# Patient Record
Sex: Female | Born: 1990 | Race: Black or African American | Hispanic: No | Marital: Single | Smoking: Former smoker
Health system: Southern US, Community
[De-identification: ages and names within clinical notes are randomized; demographics above are authoritative.]

## PROBLEM LIST (undated history)

## (undated) DIAGNOSIS — O039 Complete or unspecified spontaneous abortion without complication: Secondary | ICD-10-CM

## (undated) HISTORY — DX: Complete or unspecified spontaneous abortion without complication: O03.9

---

## 2020-08-09 ENCOUNTER — Emergency Department (HOSPITAL_COMMUNITY)
Admission: EM | Admit: 2020-08-09 | Discharge: 2020-08-10 | Disposition: A | Discharge status: Home / Self Care | Payer: Medicaid Other | Attending: Emergency Medicine | Admitting: Emergency Medicine

## 2020-08-09 DIAGNOSIS — R112 Nausea with vomiting, unspecified: Secondary | ICD-10-CM | POA: Insufficient documentation

## 2020-08-09 DIAGNOSIS — F17200 Nicotine dependence, unspecified, uncomplicated: Secondary | ICD-10-CM | POA: Insufficient documentation

## 2020-08-09 DIAGNOSIS — R1013 Epigastric pain: Secondary | ICD-10-CM | POA: Insufficient documentation

## 2020-08-09 DIAGNOSIS — R109 Unspecified abdominal pain: Secondary | ICD-10-CM

## 2020-08-10 ENCOUNTER — Other Ambulatory Visit: Payer: Self-pay

## 2020-08-10 ENCOUNTER — Emergency Department (HOSPITAL_COMMUNITY): Payer: Medicaid Other

## 2020-08-10 ENCOUNTER — Encounter (HOSPITAL_COMMUNITY): Payer: Self-pay | Admitting: Emergency Medicine

## 2020-08-10 LAB — CBC
HCT: 46.1 % — ABNORMAL HIGH (ref 36.0–46.0)
Hemoglobin: 15.3 g/dL — ABNORMAL HIGH (ref 12.0–15.0)
MCH: 31.9 pg (ref 26.0–34.0)
MCHC: 33.2 g/dL (ref 30.0–36.0)
MCV: 96.2 fL (ref 80.0–100.0)
Platelets: 272 10*3/uL (ref 150–400)
RBC: 4.79 MIL/uL (ref 3.87–5.11)
RDW: 12.4 % (ref 11.5–15.5)
WBC: 14.2 10*3/uL — ABNORMAL HIGH (ref 4.0–10.5)
nRBC: 0 % (ref 0.0–0.2)

## 2020-08-10 LAB — COMPREHENSIVE METABOLIC PANEL
ALT: 23 U/L (ref 0–44)
AST: 22 U/L (ref 15–41)
Albumin: 3.8 g/dL (ref 3.5–5.0)
Alkaline Phosphatase: 70 U/L (ref 38–126)
Anion gap: 10 (ref 5–15)
BUN: 16 mg/dL (ref 6–20)
CO2: 27 mmol/L (ref 22–32)
Calcium: 8.7 mg/dL — ABNORMAL LOW (ref 8.9–10.3)
Chloride: 104 mmol/L (ref 98–111)
Creatinine, Ser: 0.83 mg/dL (ref 0.44–1.00)
GFR, Estimated: >60 mL/min (ref >60)
Glucose, Bld: 95 mg/dL (ref 70–99)
Potassium: 3.9 mmol/L (ref 3.5–5.1)
Sodium: 141 mmol/L (ref 135–145)
Total Bilirubin: 0.5 mg/dL (ref 0.3–1.2)
Total Protein: 7.5 g/dL (ref 6.5–8.1)

## 2020-08-10 LAB — URINALYSIS, ROUTINE W REFLEX MICROSCOPIC
Bilirubin Urine: NEGATIVE
Glucose, UA: NEGATIVE mg/dL
Hgb urine dipstick: NEGATIVE
Ketones, ur: NEGATIVE mg/dL
Nitrite: NEGATIVE
Protein, ur: NEGATIVE mg/dL
Specific Gravity, Urine: 1.01 (ref 1.005–1.030)
pH: 7.5 (ref 5.0–8.0)

## 2020-08-10 LAB — LIPASE, BLOOD: Lipase: 39 U/L (ref 11–51)

## 2020-08-10 MED ORDER — OMEPRAZOLE 20 MG PO CPDR: 20.0000 mg | DELAYED_RELEASE_CAPSULE | Freq: Every day | ORAL | 1 refills | Status: DC

## 2020-08-10 MED ORDER — SUCRALFATE 1 G PO TABS: 1.0000 g | ORAL_TABLET | Freq: Four times a day (QID) | ORAL | 0 refills | Status: DC | PRN

## 2020-08-10 MED ORDER — IOHEXOL 300 MG/ML  SOLN
100.0000 mL | Freq: Once | INTRAMUSCULAR | Status: AC | PRN
  Administered 2020-08-10: 100 mL via INTRAVENOUS

## 2020-08-10 MED ORDER — ALUM & MAG HYDROXIDE-SIMETH 200-200-20 MG/5ML PO SUSP
30.0000 mL | Freq: Once | ORAL | Status: AC
  Administered 2020-08-10: 30 mL via ORAL
  Filled 2020-08-10: qty 30

## 2020-08-10 MED ORDER — PROCHLORPERAZINE EDISYLATE 10 MG/2ML IJ SOLN
10.0000 mg | Freq: Once | INTRAMUSCULAR | Status: AC
  Administered 2020-08-10: 10 mg via INTRAVENOUS
  Filled 2020-08-10: qty 2

## 2020-08-10 MED ORDER — HALOPERIDOL LACTATE 5 MG/ML IJ SOLN
2.0000 mg | Freq: Once | INTRAMUSCULAR | Status: AC
  Administered 2020-08-10: 2 mg via INTRAVENOUS
  Filled 2020-08-10: qty 1

## 2020-08-10 MED ORDER — HYDROCODONE-ACETAMINOPHEN 5-325 MG PO TABS
1.0000 | ORAL_TABLET | Freq: Once | ORAL | Status: AC
  Administered 2020-08-10: 1 via ORAL
  Filled 2020-08-10: qty 1

## 2020-08-10 MED ORDER — FAMOTIDINE IN NACL 20-0.9 MG/50ML-% IV SOLN
20.0000 mg | Freq: Once | INTRAVENOUS | Status: AC
  Administered 2020-08-10: 20 mg via INTRAVENOUS
  Filled 2020-08-10: qty 50

## 2020-08-10 MED ORDER — ONDANSETRON HCL 4 MG/2ML IJ SOLN
4.0000 mg | Freq: Once | INTRAMUSCULAR | Status: AC
  Administered 2020-08-10: 4 mg via INTRAVENOUS
  Filled 2020-08-10: qty 2

## 2020-08-10 NOTE — ED Triage Notes (Signed)
Pt with c/o "severe" lower abdominal pain that radiates around to back that started a couple of hours ago. Denies any urinary symptoms. Pt tearful in triage.

## 2020-08-10 NOTE — ED Notes (Signed)
Pt vomiting at this time- emesis bag given to pt

## 2020-08-10 NOTE — Discharge Instructions (Addendum)
You were evaluated in the Emergency Department and after careful evaluation, we did not find any emergent condition requiring admission or further testing in the hospital.  Your exam/testing today is overall reassuring.  Symptoms seem to be due to inflammation of the lining of the stomach.  We recommend taking the omeprazole medication daily to prevent pain.  You can also use the Carafate medication as needed throughout the day for discomfort.  Please return to the Emergency Department if you experience any worsening of your condition.   Thank you for allowing [place] to be a part of your care.

## 2020-08-10 NOTE — ED Provider Notes (Signed)
AP-EMERGENCY DEPT [facility] Emergency Department Provider Note MRN:  [id number]  Arrival date & time: 08/10/20     Chief Complaint   Abdominal Pain   History of Present Illness   Kristen Jennings is a 30 y.o. year-old female with a history of C-section presenting to the ED with chief complaint of abdominal.  Location: Diffuse but worse in the epigastrium and right upper and right lower quadrants Duration: 12 hours Onset: Gradual Timing: Constant Description: Sharp Severity: Severe, just as bad as labor with her kids Exacerbating/Alleviating Factors: Worse with meals today Associated Symptoms: Nausea and few episodes of nonbloody nonbilious emesis Pertinent Negatives: Denies fever, no headache or vision change, no chest pain or shortness of breath, no dysuria, no vaginal bleeding or discharge   Review of Systems  A complete 10 system review of systems was obtained and all systems are negative except as noted in the HPI and PMH.   Patient's Health History   History reviewed. No pertinent past medical history.  Past Surgical History:  Procedure Laterality Date  . CESAREAN SECTION      History reviewed. No pertinent family history.  Social History   Socioeconomic History  . Marital status: Married    Spouse name: Not on file  . Number of children: Not on file  . Years of education: Not on file  . Highest education level: Not on file  Occupational History  . Not on file  Tobacco Use  . Smoking status: Current Every Day Smoker  . Smokeless tobacco: Never Used  Vaping Use  . Vaping Use: Never used  Substance and Sexual Activity  . Alcohol use: Yes    Comment: daily (wine)  . Drug use: Never  . Sexual activity: Yes  Other Topics Concern  . Not on file  Social History Narrative  . Not on file   Social Determinants of Health   Financial Resource Strain: Not on file  Food Insecurity: Not on file  Transportation Needs: Not on file  Physical Activity: Not  on file  Stress: Not on file  Social Connections: Not on file  Intimate Partner Violence: Not on file     Physical Exam   Vitals:   08/10/20 0130 08/10/20 0330  BP: 113/64 109/69  Pulse: 79 65  Resp: 17 16  Temp:    SpO2: 100% 98%    CONSTITUTIONAL: Well-appearing, NAD NEURO:  Alert and oriented x 3, no focal deficits EYES:  eyes equal and reactive ENT/NECK:  no LAD, no JVD CARDIO: Regular rate, well-perfused, normal S1 and S2 PULM:  CTAB no wheezing or rhonchi GI/GU:  normal bowel sounds, non-distended, non-tender MSK/SPINE:  No gross deformities, no edema SKIN:  no rash, atraumatic PSYCH:  Appropriate speech and behavior  *Additional and/or pertinent findings included in MDM below  Diagnostic and Interventional Summary    EKG Interpretation  Date/Time:  Sunday August 10 2020 02:48:20 EDT Ventricular Rate:  63 PR Interval:  139 QRS Duration: 86 QT Interval:  424 QTC Calculation: 434 R Axis:   42 Text Interpretation: Sinus rhythm Confirmed by Kennis Carina [phone]) on 08/10/2020 2:50:08 AM      Labs Reviewed  COMPREHENSIVE METABOLIC PANEL - Abnormal; Notable for the following components:      Result Value   Calcium 8.7 (*)    All other components within normal limits  CBC - Abnormal; Notable for the following components:   WBC 14.2 (*)    Hemoglobin 15.3 (*)    HCT 46.1 (*)  All other components within normal limits  URINALYSIS, ROUTINE W REFLEX MICROSCOPIC - Abnormal; Notable for the following components:   Color, Urine YELLOW (*)    APPearance CLEAR (*)    Leukocytes,Ua LARGE (*)    All other components within normal limits  LIPASE, BLOOD  POC URINE PREG, ED    CT ABDOMEN PELVIS W CONTRAST  Final Result      Medications  famotidine (PEPCID) IVPB 20 mg premix (0 mg Intravenous Stopped 08/10/20 0149)  alum & mag hydroxide-simeth (MAALOX/MYLANTA) 200-200-20 MG/5ML suspension 30 mL (30 mLs Oral Given 08/10/20 0052)  ondansetron (ZOFRAN) injection 4 mg  (4 mg Intravenous Given 08/10/20 0053)  iohexol (OMNIPAQUE) 300 MG/ML solution 100 mL (100 mLs Intravenous Contrast Given 08/10/20 0109)  HYDROcodone-acetaminophen (NORCO/VICODIN) 5-325 MG per tablet 1 tablet (1 tablet Oral Given 08/10/20 0208)  prochlorperazine (COMPAZINE) injection 10 mg (10 mg Intravenous Given 08/10/20 0230)  haloperidol lactate (HALDOL) injection 2 mg (2 mg Intravenous Given 08/10/20 0244)     Procedures  /  Critical Care Procedures  ED Course and Medical Decision Making  I have reviewed the triage vital signs, the nursing notes, and pertinent available records from the EMR.  Listed above are laboratory and imaging tests that I personally ordered, reviewed, and interpreted and then considered in my medical decision making (see below for details).  Differential diagnosis including pancreatitis, gastritis, biliary colic, less likely perforated viscus given her overall nontoxic-appearing nature, reassuring vital signs, lack of fever, lack of rebound/guarding/rigidity on exam.  Given the severity of pain however, will obtain CT imaging.     CT is reassuring, labs reassuring, patient feeling much better after medications listed above, appropriate for discharge with return precautions.  Elmer Sow. Pilar Plate, MD [facility] Emergency Medicine [facility] [email or web address]  Final Clinical Impressions(s) / ED Diagnoses     ICD-10-CM   1. Abdominal pain, unspecified abdominal location  R10.9     ED Discharge Orders         Ordered    omeprazole (PRILOSEC) 20 MG capsule  Daily        08/10/20 0405    sucralfate (CARAFATE) 1 g tablet  4 times daily PRN        08/10/20 0405           Discharge Instructions Discussed with and Provided to Patient:     Discharge Instructions     You were evaluated in the Emergency Department and after careful evaluation, we did not find any emergent condition requiring admission or further testing in the hospital.  Your  exam/testing today is overall reassuring.  Symptoms seem to be due to inflammation of the lining of the stomach.  We recommend taking the omeprazole medication daily to prevent pain.  You can also use the Carafate medication as needed throughout the day for discomfort.  Please return to the Emergency Department if you experience any worsening of your condition.   Thank you for allowing [place] to be a part of your care.       Sabas Sous, MD 08/10/20 [phone]

## 2021-04-06 ENCOUNTER — Other Ambulatory Visit: Payer: Medicaid Other

## 2021-04-06 ENCOUNTER — Telehealth: Payer: Self-pay | Admitting: *Deleted

## 2021-04-06 DIAGNOSIS — O209 Hemorrhage in early pregnancy, unspecified: Secondary | ICD-10-CM

## 2021-04-06 NOTE — Telephone Encounter (Signed)
Patient came into the office stating she is early pregnant with a confirmation completed at [facility] with LMP 03/03/21.  States she had some heavy bleeding over the weekend but is lighter today and is having some mild cramping.  Informed patient she is too early for an ultrasound as she is only [redacted]w[redacted]d.  Can have blood work completed today as pregnancy is too early for ultrasound.  Pt agreeable to have blood work.  U/S scheduled for 3 weeks out but could change based on labs.

## 2021-04-07 ENCOUNTER — Other Ambulatory Visit: Payer: Self-pay | Admitting: Adult Health

## 2021-04-07 DIAGNOSIS — Z3201 Encounter for pregnancy test, result positive: Secondary | ICD-10-CM

## 2021-04-07 LAB — PROGESTERONE: Progesterone: 0.1 ng/mL

## 2021-04-07 LAB — BETA HCG QUANT (REF LAB): hCG Quant: 7 m[IU]/mL

## 2021-04-09 ENCOUNTER — Other Ambulatory Visit: Payer: Self-pay

## 2021-04-09 ENCOUNTER — Encounter (HOSPITAL_COMMUNITY): Payer: Self-pay

## 2021-04-09 ENCOUNTER — Emergency Department (HOSPITAL_COMMUNITY)
Admission: EM | Admit: 2021-04-09 | Discharge: 2021-04-09 | Disposition: A | Discharge status: Home / Self Care | Payer: Medicaid Other | Attending: Emergency Medicine | Admitting: Emergency Medicine

## 2021-04-09 DIAGNOSIS — O209 Hemorrhage in early pregnancy, unspecified: Secondary | ICD-10-CM | POA: Diagnosis present

## 2021-04-09 DIAGNOSIS — N939 Abnormal uterine and vaginal bleeding, unspecified: Secondary | ICD-10-CM

## 2021-04-09 DIAGNOSIS — Z3A01 Less than 8 weeks gestation of pregnancy: Secondary | ICD-10-CM | POA: Diagnosis not present

## 2021-04-09 DIAGNOSIS — Z87891 Personal history of nicotine dependence: Secondary | ICD-10-CM | POA: Insufficient documentation

## 2021-04-09 LAB — CBC WITH DIFFERENTIAL/PLATELET
Abs Immature Granulocytes: 0.03 10*3/uL (ref 0.00–0.07)
Basophils Absolute: 0.1 10*3/uL (ref 0.0–0.1)
Basophils Relative: 1 %
Eosinophils Absolute: 0.1 10*3/uL (ref 0.0–0.5)
Eosinophils Relative: 1 %
HCT: 45.6 % (ref 36.0–46.0)
Hemoglobin: 15.4 g/dL — ABNORMAL HIGH (ref 12.0–15.0)
Immature Granulocytes: 0 %
Lymphocytes Relative: 23 %
Lymphs Abs: 2.2 10*3/uL (ref 0.7–4.0)
MCH: 32.8 pg (ref 26.0–34.0)
MCHC: 33.8 g/dL (ref 30.0–36.0)
MCV: 97 fL (ref 80.0–100.0)
Monocytes Absolute: 0.7 10*3/uL (ref 0.1–1.0)
Monocytes Relative: 7 %
Neutro Abs: 6.5 10*3/uL (ref 1.7–7.7)
Neutrophils Relative %: 68 %
Platelets: 277 10*3/uL (ref 150–400)
RBC: 4.7 MIL/uL (ref 3.87–5.11)
RDW: 12.6 % (ref 11.5–15.5)
WBC: 9.6 10*3/uL (ref 4.0–10.5)
nRBC: 0 % (ref 0.0–0.2)

## 2021-04-09 LAB — URINALYSIS, ROUTINE W REFLEX MICROSCOPIC
Bilirubin Urine: NEGATIVE
Glucose, UA: NEGATIVE mg/dL
Hgb urine dipstick: NEGATIVE
Ketones, ur: NEGATIVE mg/dL
Leukocytes,Ua: NEGATIVE
Nitrite: NEGATIVE
Protein, ur: NEGATIVE mg/dL
Specific Gravity, Urine: 1.015 (ref 1.005–1.030)
pH: 6 (ref 5.0–8.0)

## 2021-04-09 LAB — BASIC METABOLIC PANEL
Anion gap: 6 (ref 5–15)
BUN: 13 mg/dL (ref 6–20)
CO2: 28 mmol/L (ref 22–32)
Calcium: 8.9 mg/dL (ref 8.9–10.3)
Chloride: 106 mmol/L (ref 98–111)
Creatinine, Ser: 0.85 mg/dL (ref 0.44–1.00)
GFR, Estimated: >60 mL/min (ref >60)
Glucose, Bld: 70 mg/dL (ref 70–99)
Potassium: 3.6 mmol/L (ref 3.5–5.1)
Sodium: 140 mmol/L (ref 135–145)

## 2021-04-09 LAB — HCG, QUANTITATIVE, PREGNANCY: hCG, Beta Chain, Quant, S: 1 m[IU]/mL (ref <5)

## 2021-04-09 LAB — ABO/RH: ABO/RH(D): B POS

## 2021-04-09 LAB — BETA HCG QUANT (REF LAB): hCG Quant: <1 m[IU]/mL

## 2021-04-09 NOTE — ED Notes (Addendum)
Pt left room without informing staff. PA had informed pt of discharge information and results of tests prior to her leaving.

## 2021-04-09 NOTE — ED Triage Notes (Signed)
Pt arrived POV . Pt stated she was bleeding Sun - Tues and she is [redacted] weeks pregnant. Pregnancy is confirmed 03/31/2021 by [facility]

## 2021-04-09 NOTE — Discharge Instructions (Addendum)
Lab work and exam are reassuring, you are not pregnant this time, possible that you had a miscarriage.  Please continue with all home medications  If you continue vaginal spotting would like to follow-up your OB/GYN for further evaluation.  Come back to the emergency department if you develop chest pain, shortness of breath, severe abdominal pain, uncontrolled nausea, vomiting, diarrhea.

## 2021-04-09 NOTE — ED Provider Notes (Signed)
[facility] EMERGENCY DEPARTMENT Provider Note   CSN: [id number] Arrival date & time: 04/09/21  0846     History Chief Complaint  Patient presents with   Threatened Miscarriage    Kristen Jennings is a 30 y.o. female.  HPI  Patient with medical history including cesarean section presents with complaints of vaginal bleeding.  Patient states that 2 weeks ago she had a positive home pregnancy test, she went to her primary care doctor who also confirmed that she had a positive urine pregnancy test and then she was sent to [facility].  There she had some blood work obtained showing that she had initial hCG of 7, second hCG of 1, and progesterone of less than 1.  Patient states that her last menstrual cycle was November 1, she states that she is regular.  She states that starting on Saturday she started to have vaginal spotting, on Sunday she had vaginal bleeding and then by Tuesday bleeding had since resolved.  She had no associated stomach pain, vaginal cramping, vaginal discharge, urinary symptoms, urgency, frequency, dysuria, hematuria, no stomach pains, nausea, vomit, diarrhea, no flank tenderness.  No significant GU abnormalities.  She currently has no complaints this time.  Does not endorse fevers, chills, chest pain, shortness of breath, general body aches.  History reviewed. No pertinent past medical history.  There are no problems to display for this patient.   Past Surgical History:  Procedure Laterality Date   CESAREAN SECTION       OB History     Gravida      Para      Term      Preterm      AB      Living  1      SAB      IAB      Ectopic      Multiple      Live Births  2           No family history on file.  Social History   Tobacco Use   Smoking status: Former    Types: Cigarettes   Smokeless tobacco: Never  Vaping Use   Vaping Use: Never used  Substance Use Topics   Alcohol use: Not Currently    Comment: daily (wine)   Drug  use: Not Currently    Types: Marijuana    Home Medications Prior to Admission medications   Medication Sig Start Date End Date Taking? Authorizing Provider  omeprazole (PRILOSEC) 20 MG capsule Take 1 capsule (20 mg total) by mouth daily. Patient not taking: Reported on 04/09/2021 08/10/20   Sabas Sous, MD  sucralfate (CARAFATE) 1 g tablet Take 1 tablet (1 g total) by mouth 4 (four) times daily as needed. Patient not taking: Reported on 04/09/2021 08/10/20   Sabas Sous, MD    Allergies    Shellfish allergy and Iodine  Review of Systems   Review of Systems  Constitutional:  Negative for chills and fever.  HENT:  Negative for congestion.   Respiratory:  Negative for shortness of breath.   Cardiovascular:  Negative for chest pain.  Gastrointestinal:  Negative for abdominal pain.  Genitourinary:  Negative for dysuria, enuresis, menstrual problem, pelvic pain, vaginal bleeding, vaginal discharge and vaginal pain.  Musculoskeletal:  Negative for back pain.  Skin:  Negative for rash.  Neurological:  Negative for dizziness.  Hematological:  Does not bruise/bleed easily.   Physical Exam Updated Vital Signs BP 123/84  Pulse 86   Temp 98.6 F (37 C) (Oral)   Resp 19   Ht 5\' 4"  (1.626 m)   Wt 100 kg   LMP 03/03/2021 Comment: started period  SpO2 100%   BMI 37.84 kg/m   Physical Exam Vitals and nursing note reviewed.  Constitutional:      General: She is not in acute distress.    Appearance: She is not ill-appearing.  HENT:     Head: Normocephalic and atraumatic.     Nose: No congestion.  Eyes:     Conjunctiva/sclera: Conjunctivae normal.  Cardiovascular:     Rate and Rhythm: Normal rate and regular rhythm.     Pulses: Normal pulses.     Heart sounds: No murmur heard.   No friction rub. No gallop.  Pulmonary:     Effort: No respiratory distress.     Breath sounds: No wheezing, rhonchi or rales.  Abdominal:     Palpations: Abdomen is soft.     Tenderness: There  is no abdominal tenderness. There is no right CVA tenderness or left CVA tenderness.     Comments: Abdomen nondistended, normal bowel sounds, dull to percussion, nontender to palpation, no guarding, rebound times, peritoneal sign.  Musculoskeletal:     Right lower leg: No edema.     Left lower leg: No edema.  Skin:    General: Skin is warm and dry.  Neurological:     Mental Status: She is alert.  Psychiatric:        Mood and Affect: Mood normal.    ED Results / Procedures / Treatments   Labs (all labs ordered are listed, but only abnormal results are displayed) Labs Reviewed  CBC WITH DIFFERENTIAL/PLATELET - Abnormal; Notable for the following components:      Result Value   Hemoglobin 15.4 (*)    All other components within normal limits  URINALYSIS, ROUTINE W REFLEX MICROSCOPIC  HCG, QUANTITATIVE, PREGNANCY  BASIC METABOLIC PANEL  ABO/RH    EKG None  Radiology No results found.  Procedures Procedures   Medications Ordered in ED Medications - No data to display  ED Course  I have reviewed the triage vital signs and the nursing notes.  Pertinent labs & imaging results that were available during my care of the patient were reviewed by me and considered in my medical decision making (see chart for details).    MDM Rules/Calculators/A&P                          Initial impression-presents with vaginal bleeding which is since resolved.  Alert, no acute distress, vital signs reassuring.  Possible spontaneous miscarriage will obtain UA, Rh, hCG and reassess.  Work-up-CBC unremarkable, BMP unremarkable, UA unremarkable, hCG quantitative 1 Rh+  Rule out-I have low suspicion for ectopic pregnancy as she has no pelvic tenderness, Rh quantitative is 1, which has decreased from 7.  I have low suspicion for ovarian torsion as she has no pelvic tenderness, presentation is atypical etiology.   low suspicion for UTI, Pilo, kidney stone as UA is negative for signs of infection.  I  have low suspicion for PID she is not endorsing any urinary symptoms, no vaginal discharge, presentation atypical etiology.  I have low suspicion for appendicitis and/or diverticulitis, she has no tenderness in her lower quadrants, no constipation, no diarrhea, no systemic infection present.  Pelvic exam was deferred as she is having no vaginal or pelvic tenderness abdomen was nontender on  my examination.  Plan-  Vaginal bleeding since resolved-unclear etiology but most likely patient had a spontaneous abortion, she is Rh+ no need for RhoGAM, will have her follow-up with her OB/GYN if bleeding continues.  Given strict return precautions.  Vital signs have remained stable, no indication for hospital admission.  Patient given at home care as well strict return precautions.  Patient verbalized that they understood agreed to said plan.  Final Clinical Impression(s) / ED Diagnoses Final diagnoses:  Vaginal bleeding    Rx / DC Orders ED Discharge Orders     None        Carroll Sage, PA-C 04/09/21 1140    Tanda Rockers A, DO 04/09/21 [phone]

## 2021-04-28 ENCOUNTER — Other Ambulatory Visit: Payer: Medicaid Other

## 2021-10-07 ENCOUNTER — Ambulatory Visit (INDEPENDENT_AMBULATORY_CARE_PROVIDER_SITE_OTHER): Payer: Medicaid Other | Admitting: *Deleted

## 2021-10-07 ENCOUNTER — Encounter: Payer: Self-pay | Admitting: *Deleted

## 2021-10-07 ENCOUNTER — Other Ambulatory Visit: Payer: Self-pay | Admitting: Adult Health

## 2021-10-07 VITALS — BP 129/89 | HR 102 | Ht 64.0 in | Wt 239.0 lb

## 2021-10-07 DIAGNOSIS — Z3201 Encounter for pregnancy test, result positive: Secondary | ICD-10-CM

## 2021-10-07 LAB — POCT URINE PREGNANCY: Preg Test, Ur: POSITIVE — AB

## 2021-10-07 MED ORDER — PRENATAL PLUS 27-1 MG PO TABS: 1.0000 | ORAL_TABLET | Freq: Every day | ORAL | 12 refills | Status: DC

## 2021-10-07 MED ORDER — PROMETHAZINE HCL 25 MG PO TABS: 25.0000 mg | ORAL_TABLET | Freq: Four times a day (QID) | ORAL | 1 refills | Status: DC | PRN

## 2021-10-07 NOTE — Progress Notes (Signed)
   NURSE VISIT- PREGNANCY CONFIRMATION   SUBJECTIVE:  Kristen Jennings is a 31 y.o. [phone] female at [redacted]w[redacted]d by certain LMP of Patient's last menstrual period was 09/08/2021. Here for pregnancy confirmation.  Home pregnancy test: positive x 3   She reports  n/v, light cramping .  She is not taking prenatal vitamins.    OBJECTIVE:  BP 129/89 (BP Location: Left Arm, Patient Position: Sitting, Cuff Size: Normal)   Pulse (!) 102   Ht 5\' 4"  (1.626 m)   Wt 239 lb (108.4 kg)   LMP 09/08/2021   BMI 41.02 kg/m   Appears well, in no apparent distress  Results for orders placed or performed in visit on 10/07/21 (from the past 24 hour(s))  POCT urine pregnancy   Collection Time: 10/07/21  2:37 PM  Result Value Ref Range   Preg Test, Ur Positive (A) Negative    ASSESSMENT: Positive pregnancy test, [redacted]w[redacted]d by LMP    PLAN: Schedule for dating ultrasound: pending quant results.                                                                            Prenatal vitamins: note routed to [facility] to send prescription   Nausea medicines: requested-note routed to [facility] to send prescription   OB packet given: Yes  [redacted]w[redacted]d  10/07/2021 2:44 PM

## 2021-10-07 NOTE — Progress Notes (Signed)
Will rx prenatal vitamin and phenergan

## 2021-10-08 ENCOUNTER — Telehealth: Payer: Self-pay

## 2021-10-08 DIAGNOSIS — Z3490 Encounter for supervision of normal pregnancy, unspecified, unspecified trimester: Secondary | ICD-10-CM

## 2021-10-08 LAB — BETA HCG QUANT (REF LAB): hCG Quant: 18 m[IU]/mL

## 2021-10-08 NOTE — Telephone Encounter (Signed)
DONE

## 2021-10-08 NOTE — Telephone Encounter (Signed)
Returned patient's call.  Informed she is between 3-4 weeks but will need to repeat bloodwork tomorrow to make sure level is rising.  Orders placed.  Denies bleeding. Reassured patient.  No further questions.

## 2021-10-08 NOTE — Telephone Encounter (Signed)
Could someone please call her about her HCG levels.   She called crying because she saw the results in her Mychart.

## 2021-10-08 NOTE — Addendum Note (Signed)
Addended by: Moss Mc on: 10/08/2021 12:07 PM   Modules accepted: Orders

## 2021-10-10 LAB — BETA HCG QUANT (REF LAB): hCG Quant: 68 m[IU]/mL

## 2021-10-12 ENCOUNTER — Telehealth: Payer: Self-pay

## 2021-10-12 NOTE — Telephone Encounter (Signed)
Pt wants to come back in and get her HCG levels redrawn.  Would like for a nurse to call her and let her know when the orders are in.

## 2021-10-13 NOTE — Telephone Encounter (Signed)
Mychart message sent to patient from [place] regarding labs.

## 2021-10-15 ENCOUNTER — Emergency Department (HOSPITAL_COMMUNITY): Payer: Medicaid Other

## 2021-10-15 ENCOUNTER — Encounter (HOSPITAL_COMMUNITY): Payer: Self-pay | Admitting: *Deleted

## 2021-10-15 ENCOUNTER — Emergency Department (HOSPITAL_COMMUNITY)
Admission: EM | Admit: 2021-10-15 | Discharge: 2021-10-15 | Disposition: A | Discharge status: Home / Self Care | Payer: Medicaid Other | Attending: Student | Admitting: Student

## 2021-10-15 ENCOUNTER — Telehealth: Payer: Self-pay | Admitting: *Deleted

## 2021-10-15 ENCOUNTER — Other Ambulatory Visit: Payer: Self-pay

## 2021-10-15 DIAGNOSIS — O219 Vomiting of pregnancy, unspecified: Secondary | ICD-10-CM | POA: Insufficient documentation

## 2021-10-15 DIAGNOSIS — E876 Hypokalemia: Secondary | ICD-10-CM | POA: Diagnosis not present

## 2021-10-15 DIAGNOSIS — O209 Hemorrhage in early pregnancy, unspecified: Secondary | ICD-10-CM | POA: Diagnosis present

## 2021-10-15 DIAGNOSIS — Z3A01 Less than 8 weeks gestation of pregnancy: Secondary | ICD-10-CM | POA: Insufficient documentation

## 2021-10-15 DIAGNOSIS — Z87891 Personal history of nicotine dependence: Secondary | ICD-10-CM | POA: Diagnosis not present

## 2021-10-15 DIAGNOSIS — O3680X0 Pregnancy with inconclusive fetal viability, not applicable or unspecified: Secondary | ICD-10-CM

## 2021-10-15 DIAGNOSIS — O99281 Endocrine, nutritional and metabolic diseases complicating pregnancy, first trimester: Secondary | ICD-10-CM | POA: Diagnosis not present

## 2021-10-15 DIAGNOSIS — O4690 Antepartum hemorrhage, unspecified, unspecified trimester: Secondary | ICD-10-CM

## 2021-10-15 LAB — URINALYSIS, ROUTINE W REFLEX MICROSCOPIC
Bilirubin Urine: NEGATIVE
Glucose, UA: NEGATIVE mg/dL
Ketones, ur: NEGATIVE mg/dL
Leukocytes,Ua: NEGATIVE
Nitrite: NEGATIVE
Protein, ur: 30 mg/dL — AB
Specific Gravity, Urine: 1.024 (ref 1.005–1.030)
pH: 5 (ref 5.0–8.0)

## 2021-10-15 LAB — CBC WITH DIFFERENTIAL/PLATELET
Abs Immature Granulocytes: 0.02 10*3/uL (ref 0.00–0.07)
Basophils Absolute: 0.1 10*3/uL (ref 0.0–0.1)
Basophils Relative: 1 %
Eosinophils Absolute: 0.1 10*3/uL (ref 0.0–0.5)
Eosinophils Relative: 1 %
HCT: 43.7 % (ref 36.0–46.0)
Hemoglobin: 15.1 g/dL — ABNORMAL HIGH (ref 12.0–15.0)
Immature Granulocytes: 0 %
Lymphocytes Relative: 26 %
Lymphs Abs: 2.8 10*3/uL (ref 0.7–4.0)
MCH: 31.7 pg (ref 26.0–34.0)
MCHC: 34.6 g/dL (ref 30.0–36.0)
MCV: 91.6 fL (ref 80.0–100.0)
Monocytes Absolute: 0.9 10*3/uL (ref 0.1–1.0)
Monocytes Relative: 8 %
Neutro Abs: 6.8 10*3/uL (ref 1.7–7.7)
Neutrophils Relative %: 64 %
Platelets: 295 10*3/uL (ref 150–400)
RBC: 4.77 MIL/uL (ref 3.87–5.11)
RDW: 12.6 % (ref 11.5–15.5)
WBC: 10.7 10*3/uL — ABNORMAL HIGH (ref 4.0–10.5)
nRBC: 0 % (ref 0.0–0.2)

## 2021-10-15 LAB — COMPREHENSIVE METABOLIC PANEL
ALT: 21 U/L (ref 0–44)
AST: 20 U/L (ref 15–41)
Albumin: 3.8 g/dL (ref 3.5–5.0)
Alkaline Phosphatase: 70 U/L (ref 38–126)
Anion gap: 7 (ref 5–15)
BUN: 10 mg/dL (ref 6–20)
CO2: 23 mmol/L (ref 22–32)
Calcium: 8.7 mg/dL — ABNORMAL LOW (ref 8.9–10.3)
Chloride: 107 mmol/L (ref 98–111)
Creatinine, Ser: 0.76 mg/dL (ref 0.44–1.00)
GFR, Estimated: >60 mL/min (ref >60)
Glucose, Bld: 129 mg/dL — ABNORMAL HIGH (ref 70–99)
Potassium: 3.4 mmol/L — ABNORMAL LOW (ref 3.5–5.1)
Sodium: 137 mmol/L (ref 135–145)
Total Bilirubin: 0.7 mg/dL (ref 0.3–1.2)
Total Protein: 7.7 g/dL (ref 6.5–8.1)

## 2021-10-15 LAB — HCG, QUANTITATIVE, PREGNANCY: hCG, Beta Chain, Quant, S: 563 m[IU]/mL — ABNORMAL HIGH (ref <5)

## 2021-10-15 LAB — LIPASE, BLOOD: Lipase: 34 U/L (ref 11–51)

## 2021-10-15 NOTE — ED Provider Notes (Signed)
[facility] EMERGENCY DEPARTMENT Provider Note  CSN: [id number] Arrival date & time: 10/15/21 1132  Chief Complaint(s) Abdominal Pain  HPI Kristen Jennings is a 31 y.o. female G4 P2 currently [redacted] weeks pregnant by LMP who presents emergency department for evaluation of left lower quadrant abdominal pain, vomiting and vaginal bleeding in pregnancy.  Patient states that starting this morning she had a feeling of intense pressure in the left lower pelvis with associated nausea and vomiting.  She spoke with the triage nursing line Who instructed to go to the emergency department to rule out ectopic.  On arrival to the emergency department, she initially had no complaints of vaginal bleeding but when giving a urine sample, started to have vaginal bleeding.  Denies associated chest pain, shortness of breath, headache, fever or other systemic symptoms.   Past Medical History History reviewed. No pertinent past medical history. There are no problems to display for this patient.  Home Medication(s) Prior to Admission medications   Medication Sig Start Date End Date Taking? Authorizing Provider  prenatal vitamin w/FE, FA (PRENATAL 1 + 1) 27-1 MG TABS tablet Take 1 tablet by mouth daily at 12 noon. 10/07/21   Adline Potter, NP  promethazine (PHENERGAN) 25 MG tablet Take 1 tablet (25 mg total) by mouth every 6 (six) hours as needed for nausea or vomiting. 10/07/21   Adline Potter, NP                                                                                                                                    Past Surgical History Past Surgical History:  Procedure Laterality Date   CESAREAN SECTION     Family History Family History  Problem Relation Age of Onset   Bronchitis Maternal Grandmother    HIV/AIDS Maternal Grandfather    Drug abuse Father    Diabetes Mother    Hypertension Mother    Mental illness Mother    Hypertension Sister    Asthma Sister     Social  History Social History   Tobacco Use   Smoking status: Former    Types: Cigarettes   Smokeless tobacco: Never  Vaping Use   Vaping Use: Never used  Substance Use Topics   Alcohol use: Not Currently    Comment: daily (wine)   Drug use: Not Currently    Types: Marijuana   Allergies Shellfish allergy and Iodine  Review of Systems Review of Systems  Gastrointestinal:  Positive for abdominal pain, nausea and vomiting.  Genitourinary:  Positive for vaginal bleeding.    Physical Exam Vital Signs  I have reviewed the triage vital signs BP 128/85 (BP Location: Right Arm)   Pulse (!) 117   Temp 98 F (36.7 C) (Oral)   Resp 20   Ht 5\' 4"  (1.626 m)   Wt 104.3 kg   LMP 09/08/2021   SpO2 100%   BMI 39.48 kg/m  Physical Exam Vitals and nursing note reviewed.  Constitutional:      General: She is not in acute distress.    Appearance: She is well-developed.  HENT:     Head: Normocephalic and atraumatic.  Eyes:     Conjunctiva/sclera: Conjunctivae normal.  Cardiovascular:     Rate and Rhythm: Normal rate and regular rhythm.     Heart sounds: No murmur heard. Pulmonary:     Effort: Pulmonary effort is normal. No respiratory distress.     Breath sounds: Normal breath sounds.  Abdominal:     Palpations: Abdomen is soft.     Tenderness: There is abdominal tenderness in the left lower quadrant.  Genitourinary:    Vagina: Bleeding present.     Cervix: Normal.  Musculoskeletal:        General: No swelling.     Cervical back: Neck supple.  Skin:    General: Skin is warm and dry.     Capillary Refill: Capillary refill takes less than 2 seconds.  Neurological:     Mental Status: She is alert.  Psychiatric:        Mood and Affect: Mood normal.     ED Results and Treatments Labs (all labs ordered are listed, but only abnormal results are displayed) Labs Reviewed  CBC WITH DIFFERENTIAL/PLATELET - Abnormal; Notable for the following components:      Result Value   WBC  10.7 (*)    Hemoglobin 15.1 (*)    All other components within normal limits  COMPREHENSIVE METABOLIC PANEL - Abnormal; Notable for the following components:   Potassium 3.4 (*)    Glucose, Bld 129 (*)    Calcium 8.7 (*)    All other components within normal limits  URINALYSIS, ROUTINE W REFLEX MICROSCOPIC - Abnormal; Notable for the following components:   APPearance HAZY (*)    Hgb urine dipstick LARGE (*)    Protein, ur 30 (*)    Bacteria, UA RARE (*)    All other components within normal limits  HCG, QUANTITATIVE, PREGNANCY - Abnormal; Notable for the following components:   hCG, Beta Chain, Quant, S 563 (*)    All other components within normal limits  LIPASE, BLOOD                                                                                                                          Radiology US OB Comp < 14 Wks  Result Date: 10/15/2021 CLINICAL DATA:  Vaginal bleeding. Left lower quadrant abdominal pain for 1 day. EXAM: OBSTETRIC <14 WK ULTRASOUND TECHNIQUE: Transabdominal ultrasound was performed for evaluation of the gestation as well as the maternal uterus and adnexal regions. COMPARISON:  CT abdomen and pelvis 08/10/2020 FINDINGS: Intrauterine gestational sac: None. Maternal uterus/adnexae: Thickened endometrium measuring up to 1.8 cm in the fundus without a focal abnormality identified. Unremarkable appearance of the right ovary. Suspected collapsed corpus luteum within the left ovary. No suspicious adnexal mass or free fluid. IMPRESSION: No  intrauterine pregnancy or findings suspicious for ectopic pregnancy. Findings are consistent with pregnancy of unknown location and may reflect early intrauterine pregnancy not yet visualized sonographically, occult ectopic pregnancy, or failed pregnancy. Electronically Signed   By: Sebastian Ache M.D.   On: 10/15/2021 13:18   US OB Transvaginal  Result Date: 10/15/2021 CLINICAL DATA:  Vaginal bleeding. Left lower quadrant abdominal pain for  1 day. EXAM: OBSTETRIC <14 WK ULTRASOUND TECHNIQUE: Transabdominal ultrasound was performed for evaluation of the gestation as well as the maternal uterus and adnexal regions. COMPARISON:  CT abdomen and pelvis 08/10/2020 FINDINGS: Intrauterine gestational sac: None. Maternal uterus/adnexae: Thickened endometrium measuring up to 1.8 cm in the fundus without a focal abnormality identified. Unremarkable appearance of the right ovary. Suspected collapsed corpus luteum within the left ovary. No suspicious adnexal mass or free fluid. IMPRESSION: No intrauterine pregnancy or findings suspicious for ectopic pregnancy. Findings are consistent with pregnancy of unknown location and may reflect early intrauterine pregnancy not yet visualized sonographically, occult ectopic pregnancy, or failed pregnancy. Electronically Signed   By: Sebastian Ache M.D.   On: 10/15/2021 13:18    Pertinent labs & imaging results that were available during my care of the patient were reviewed by me and considered in my medical decision making (see MDM for details).  Medications Ordered in ED Medications - No data to display                                                                                                                                   Procedures Procedures  (including critical care time)  Medical Decision Making / ED Course   This patient presents to the ED for concern of abdominal pain, nausea, vomiting, vaginal bleeding in pregnancy this involves an extensive number of treatment options, and is a complaint that carries with it a high risk of complications and morbidity.  The differential diagnosis includes threatened abortion, inevitable abortion, subchorionic hematoma, vaginal laceration, ectopic pregnancy  MDM: Patient seen emergency room for evaluation of multiple complaints described above.  Physical exam largely unremarkable with no appreciable abdominal tenderness to palpation, pelvic exam with a closed os  but a small amount of bleeding in the vaginal vault.  Laboratory evaluation with a leukocytosis to 10.7, mild hypokalemia to 3.4, urinalysis with large blood but no evidence of infection.  Beta quant 563.  Transvaginal ultrasound with pregnancy of unknown location, and cannot definitively rule out ectopic pregnancy as no fetal pole is visible.  I consulted the patient's OB/GYN who states that they will follow-up with the patient to have her be seen in the MAU in 48 hours for repeat beta quant testing.  On reevaluation, patient's abdominal pain has resolved and she was given strict return precautions of which she voiced understanding.  Patient then discharged.   Additional history obtained:  -External records from outside source obtained and reviewed including: Chart review including previous notes, labs, imaging,  consultation notes   Lab Tests: -I ordered, reviewed, and interpreted labs.   The pertinent results include:   Labs Reviewed  CBC WITH DIFFERENTIAL/PLATELET - Abnormal; Notable for the following components:      Result Value   WBC 10.7 (*)    Hemoglobin 15.1 (*)    All other components within normal limits  COMPREHENSIVE METABOLIC PANEL - Abnormal; Notable for the following components:   Potassium 3.4 (*)    Glucose, Bld 129 (*)    Calcium 8.7 (*)    All other components within normal limits  URINALYSIS, ROUTINE W REFLEX MICROSCOPIC - Abnormal; Notable for the following components:   APPearance HAZY (*)    Hgb urine dipstick LARGE (*)    Protein, ur 30 (*)    Bacteria, UA RARE (*)    All other components within normal limits  HCG, QUANTITATIVE, PREGNANCY - Abnormal; Notable for the following components:   hCG, Beta Chain, Quant, S 563 (*)    All other components within normal limits  LIPASE, BLOOD      Imaging Studies ordered: I ordered imaging studies including TVUS I independently visualized and interpreted imaging. I agree with the radiologist  interpretation   Medicines ordered and prescription drug management: No orders of the defined types were placed in this encounter.   -I have reviewed the patients home medicines and have made adjustments as needed  Critical interventions none  Consultations Obtained: I requested consultation with the OB/GYN,  and discussed lab and imaging findings as well as pertinent plan - they recommend: Outpatient MAU follow-up in 48 hours for beta quant repeat   Cardiac Monitoring: The patient was maintained on a cardiac monitor.  I personally viewed and interpreted the cardiac monitored which showed an underlying rhythm of: NSR  Social Determinants of Health:  Factors impacting patients care include: none   Reevaluation: After the interventions noted above, I reevaluated the patient and found that they have :improved  Co morbidities that complicate the patient evaluation History reviewed. No pertinent past medical history.    Dispostion: I considered admission for this patient, but she currently does not meet inpatient criteria for admission.  Patient safe for discharge with outpatient 48-hour follow-up in the MAU     Final Clinical Impression(s) / ED Diagnoses Final diagnoses:  None     @PCDICTATION @    , MD 10/15/21 1529

## 2021-10-15 NOTE — ED Triage Notes (Addendum)
Pt with left lower abd pain with nausea, denies emesis or diarrhea.  Denies burning with urination. Pt tearful in triage.  Pt states she is [redacted] weeks pregnant.  Denies any vaginal bleeding.

## 2021-10-15 NOTE — ED Notes (Signed)
Pt initially came to the ED for pain and N/V; pt is actively currently vomiting. However, pt went to give [place] a urine sample and she noticed blood/spotting; Pt is now concerned about a miscarriage due to her previous Hx of miscarriages--MD made aware

## 2021-10-15 NOTE — Discharge Instructions (Signed)
You were seen in the emergency department today for evaluation of vaginal bleeding in pregnancy.  It is very important that you follow-up with your OB/GYN in 48 hours for repeat blood testing.  Please call them to set up an appointment as soon as possible.  Return the emergency department if you have new or worsening abdominal pain, worsening vaginal bleeding or any other concerning symptoms.

## 2021-10-15 NOTE — Telephone Encounter (Signed)
Patient called crying stating she woke up this morning with significant left lower pelvic pain.  "Hard to describe but feels like intense pressure".  Also states the pain radiates down her left leg and hurts worse when she lays down. Denies bleeding. Advised patient to go to [facility] for evaluation to r/o ectopic pregnancy.  Patient asked if she really needed to go and advised that if she was in that much discomfort then she needed to be evaluated.  Pt stated she would go.

## 2021-10-17 ENCOUNTER — Encounter (HOSPITAL_COMMUNITY): Payer: Self-pay | Admitting: Emergency Medicine

## 2021-10-17 ENCOUNTER — Other Ambulatory Visit: Payer: Self-pay

## 2021-10-17 ENCOUNTER — Emergency Department (HOSPITAL_COMMUNITY)
Admission: EM | Admit: 2021-10-17 | Discharge: 2021-10-17 | Disposition: A | Discharge status: Home / Self Care | Payer: Medicaid Other | Attending: Emergency Medicine | Admitting: Emergency Medicine

## 2021-10-17 ENCOUNTER — Other Ambulatory Visit (HOSPITAL_COMMUNITY): Payer: Medicaid Other | Attending: Obstetrics & Gynecology

## 2021-10-17 DIAGNOSIS — Z3A01 Less than 8 weeks gestation of pregnancy: Secondary | ICD-10-CM | POA: Diagnosis not present

## 2021-10-17 DIAGNOSIS — O209 Hemorrhage in early pregnancy, unspecified: Secondary | ICD-10-CM | POA: Diagnosis present

## 2021-10-17 DIAGNOSIS — N939 Abnormal uterine and vaginal bleeding, unspecified: Secondary | ICD-10-CM

## 2021-10-17 DIAGNOSIS — O200 Threatened abortion: Secondary | ICD-10-CM | POA: Insufficient documentation

## 2021-10-17 LAB — BASIC METABOLIC PANEL
Anion gap: 5 (ref 5–15)
BUN: 11 mg/dL (ref 6–20)
CO2: 25 mmol/L (ref 22–32)
Calcium: 8.8 mg/dL — ABNORMAL LOW (ref 8.9–10.3)
Chloride: 107 mmol/L (ref 98–111)
Creatinine, Ser: 0.74 mg/dL (ref 0.44–1.00)
GFR, Estimated: >60 mL/min (ref >60)
Glucose, Bld: 78 mg/dL (ref 70–99)
Potassium: 3.8 mmol/L (ref 3.5–5.1)
Sodium: 137 mmol/L (ref 135–145)

## 2021-10-17 LAB — URINALYSIS, ROUTINE W REFLEX MICROSCOPIC
Bilirubin Urine: NEGATIVE
Glucose, UA: NEGATIVE mg/dL
Hgb urine dipstick: NEGATIVE
Ketones, ur: NEGATIVE mg/dL
Leukocytes,Ua: NEGATIVE
Nitrite: NEGATIVE
Protein, ur: NEGATIVE mg/dL
Specific Gravity, Urine: 1.021 (ref 1.005–1.030)
pH: 7 (ref 5.0–8.0)

## 2021-10-17 LAB — HCG, QUANTITATIVE, PREGNANCY: hCG, Beta Chain, Quant, S: 60 m[IU]/mL — ABNORMAL HIGH (ref <5)

## 2021-10-17 NOTE — ED Triage Notes (Signed)
Patient c/o vaginal bleeding that started Thursday. Patient seen here in ED for the bleeding on Thursday and had blood work and ultrasound, patient approx [redacted] weeks pregnant. Hx of miscarriage. Per patient still spotting. Denies any abnormal cramping. Patient's appointment with [facility] not until September 10th.

## 2021-10-17 NOTE — Discharge Instructions (Addendum)
Call [facility] on Monday to arrange follow-up appointment.  Return emergency department for any new or worsening symptoms.

## 2021-10-17 NOTE — ED Notes (Addendum)
Went to discharge patient, patient not in VT2, left without discharge papers and was unable to get discharge VS. PA did go over discharge prior to patient leaving.

## 2021-10-19 NOTE — ED Provider Notes (Signed)
[facility] EMERGENCY DEPARTMENT Provider Note   CSN: [id number] Arrival date & time: 10/17/21  1123     History {Add pertinent medical, surgical, social history, OB history to HPI:1} Chief Complaint  Patient presents with   Vaginal Bleeding    Kristen Jennings is a 31 y.o. female.   Vaginal Bleeding Associated symptoms: abdominal pain   Associated symptoms: no back pain, no dizziness, no fever and no nausea        Kristen Jennings is a 31 y.o. female [redacted] weeks pregnant by LMP, G4P1 who presents to the Emergency Department for recheck of Hcg level.  She was seen here two days prior and evaluated for lower abdominal cramping and vaginal bleeding.  She is aware she may be having a miscarriage, hx of same. She continues to have small amount of vaginal bleeding, abdominal cramping is improving.  She has appt with OB/GYN in early July.  She denies vomiting, nausea, fever or any increasing abdominal pain or bleeding.       Home Medications Prior to Admission medications   Medication Sig Start Date End Date Taking? Authorizing Provider  prenatal vitamin w/FE, FA (PRENATAL 1 + 1) 27-1 MG TABS tablet Take 1 tablet by mouth daily at 12 noon. 10/07/21   Adline Potter, NP  promethazine (PHENERGAN) 25 MG tablet Take 1 tablet (25 mg total) by mouth every 6 (six) hours as needed for nausea or vomiting. 10/07/21   Adline Potter, NP      Allergies    Shellfish allergy and Iodine    Review of Systems   Review of Systems  Constitutional:  Negative for appetite change, chills and fever.  Respiratory:  Negative for shortness of breath.   Cardiovascular:  Negative for chest pain.  Gastrointestinal:  Positive for abdominal pain. Negative for diarrhea, nausea and vomiting.  Genitourinary:  Positive for vaginal bleeding. Negative for flank pain.  Musculoskeletal:  Negative for back pain.  Neurological:  Negative for dizziness.    Physical Exam Updated Vital Signs BP (!) 142/83  (BP Location: Right Arm)   Pulse 98   Temp 98.7 F (37.1 C) (Oral)   Resp 15   Ht 5\' 4"  (1.626 m)   Wt 108 kg   LMP 09/08/2021   SpO2 98%   BMI 40.85 kg/m  Physical Exam Vitals and nursing note reviewed.  Constitutional:      General: She is not in acute distress.    Appearance: Normal appearance. She is not ill-appearing or toxic-appearing.  Cardiovascular:     Rate and Rhythm: Normal rate and regular rhythm.     Pulses: Normal pulses.  Pulmonary:     Effort: Pulmonary effort is normal. No respiratory distress.  Abdominal:     General: There is no distension.     Palpations: Abdomen is soft.     Tenderness: There is no abdominal tenderness.  Musculoskeletal:        General: Normal range of motion.  Skin:    General: Skin is warm.     Capillary Refill: Capillary refill takes less than 2 seconds.     Findings: No erythema or rash.  Neurological:     General: No focal deficit present.     Mental Status: She is alert.     Sensory: No sensory deficit.     Motor: No weakness.     ED Results / Procedures / Treatments   Labs (all labs ordered are listed, but only abnormal results are  displayed) Labs Reviewed  BASIC METABOLIC PANEL - Abnormal; Notable for the following components:      Result Value   Calcium 8.8 (*)    All other components within normal limits  HCG, QUANTITATIVE, PREGNANCY - Abnormal; Notable for the following components:   hCG, Beta Chain, Quant, S 60 (*)    All other components within normal limits  URINALYSIS, ROUTINE W REFLEX MICROSCOPIC    EKG None  Radiology No results found.  Procedures Procedures  {Document cardiac monitor, telemetry assessment procedure when appropriate:1}  Medications Ordered in ED Medications - No data to display  ED Course/ Medical Decision Making/ A&P                           Medical Decision Making Pt here for recheck of her Hcg level.  Seen here two days ago and had   Amount and/or Complexity of Data  Reviewed Labs: ordered.     {Document critical care time when appropriate:1} {Document review of labs and clinical decision tools ie heart score, Chads2Vasc2 etc:1}  {Document your independent review of radiology images, and any outside records:1} {Document your discussion with family members, caretakers, and with consultants:1} {Document social determinants of health affecting pt's care:1} {Document your decision making why or why not admission, treatments were needed:1} Final Clinical Impression(s) / ED Diagnoses Final diagnoses:  Vaginal bleeding  Threatened miscarriage    Rx / DC Orders ED Discharge Orders     None

## 2021-10-22 ENCOUNTER — Telehealth: Payer: Self-pay | Admitting: *Deleted

## 2021-10-22 DIAGNOSIS — O039 Complete or unspecified spontaneous abortion without complication: Secondary | ICD-10-CM

## 2021-10-22 NOTE — Telephone Encounter (Signed)
Patient called stating she has recently had a miscarriage.  Very tearful and wanting to know why she keeps having a miscarriage. Was supposed to have f/u bloodwork but doesn't want to do it because it will "just remind me of my loss".  Encouraged to have f/u HCG done at this ensures all of pregnancy has passed.  Appt made with Dr Despina Hidden to consult for recurrent pregnancy loss.  Pt agreeable to plan with no further questions.

## 2021-10-23 LAB — BETA HCG QUANT (REF LAB): hCG Quant: 3 m[IU]/mL

## 2021-11-06 ENCOUNTER — Encounter: Payer: Medicaid Other | Admitting: Adult Health

## 2021-11-09 ENCOUNTER — Other Ambulatory Visit: Payer: Medicaid Other

## 2021-11-09 ENCOUNTER — Other Ambulatory Visit (HOSPITAL_COMMUNITY)
Admission: RE | Admit: 2021-11-09 | Discharge: 2021-11-09 | Disposition: A | Discharge status: Home / Self Care | Payer: Medicaid Other | Source: Ambulatory Visit | Attending: Obstetrics & Gynecology | Admitting: Obstetrics & Gynecology

## 2021-11-09 ENCOUNTER — Ambulatory Visit (INDEPENDENT_AMBULATORY_CARE_PROVIDER_SITE_OTHER): Payer: Medicaid Other | Admitting: Adult Health

## 2021-11-09 ENCOUNTER — Encounter: Payer: Self-pay | Admitting: Adult Health

## 2021-11-09 VITALS — BP 130/89 | HR 84 | Ht 64.0 in | Wt 240.0 lb

## 2021-11-09 DIAGNOSIS — Z124 Encounter for screening for malignant neoplasm of cervix: Secondary | ICD-10-CM

## 2021-11-09 DIAGNOSIS — Z3202 Encounter for pregnancy test, result negative: Secondary | ICD-10-CM | POA: Insufficient documentation

## 2021-11-09 DIAGNOSIS — N96 Recurrent pregnancy loss: Secondary | ICD-10-CM | POA: Diagnosis not present

## 2021-11-09 DIAGNOSIS — Z319 Encounter for procreative management, unspecified: Secondary | ICD-10-CM | POA: Diagnosis not present

## 2021-11-09 HISTORY — DX: Encounter for screening for malignant neoplasm of cervix: Z12.4

## 2021-11-09 HISTORY — DX: Encounter for pregnancy test, result negative: Z32.02

## 2021-11-09 LAB — POCT URINE PREGNANCY: Preg Test, Ur: NEGATIVE

## 2021-11-09 NOTE — Progress Notes (Signed)
  Subjective:     Patient ID: Kristen Jennings, female   DOB: 1990/05/19, 31 y.o.   MRN: [id number]  HPI Kristen Jennings is a 31 year old black female, single, [id number] in for follow up after miscarriage in June, says she has had 2 miscarriages recently, different partners. She needs a pap PCP is [facility]   Review of Systems No bleeding now Periods regular, every 28 years, last 3-5 days  Reviewed past medical,surgical, social and family history. Reviewed medications and allergies.     Objective:   Physical Exam BP 130/89 (BP Location: Left Arm, Patient Position: Sitting, Cuff Size: Large)   Pulse 84   Ht 5\' 4"  (1.626 m)   Wt 240 lb (108.9 kg)   LMP 09/08/2021   Breastfeeding No   BMI 41.20 kg/m     UPT is negative Skin warm and dry.Pelvic: external genitalia is normal in appearance no lesions, has clit tod, vagina is pink and moist,urethra has no lesions or masses noted, cervix:smooth and bulbous,Pap with HR HPV genotyping and GC/CHL performed,  uterus: normal size, shape and contour, non tender, no masses felt, adnexa: no masses or tenderness noted. Bladder is non tender and no masses felt.  Fall risk is low  Upstream - 11/09/21 1414       Pregnancy Intention Screening   Does the patient want to become pregnant in the next year? Yes    Does the patient's partner want to become pregnant in the next year? Yes    Would the patient like to discuss contraceptive options today? No      Contraception Wrap Up   Current Method Female Condom    End Method Pregnant/Seeking Pregnancy            Examination chaperoned by 01/10/22 LPN  Assessment:        1. Pregnancy examination or test, negative result  2. Routine Papanicolaou smear Pap sent  Pap in 3 years if normal   3. History of recurrent miscarriages, not currently pregnant Will check progesterone level when +HPT  4. Patient desires pregnancy Take PNV Call when gets +HPT    Plan:     Follow up  prn

## 2021-11-11 LAB — CYTOLOGY - PAP
Chlamydia: NEGATIVE
Comment: NEGATIVE
Comment: NEGATIVE
Comment: NORMAL
Diagnosis: NEGATIVE
High risk HPV: NEGATIVE
Neisseria Gonorrhea: NEGATIVE

## 2021-11-25 ENCOUNTER — Other Ambulatory Visit (HOSPITAL_COMMUNITY): Payer: Self-pay | Admitting: Family Medicine

## 2021-11-25 ENCOUNTER — Ambulatory Visit (HOSPITAL_COMMUNITY)
Admission: RE | Admit: 2021-11-25 | Discharge: 2021-11-25 | Disposition: A | Discharge status: Home / Self Care | Payer: Medicaid Other | Source: Ambulatory Visit | Attending: Family Medicine | Admitting: Family Medicine

## 2021-11-25 DIAGNOSIS — M25561 Pain in right knee: Secondary | ICD-10-CM | POA: Insufficient documentation

## 2021-11-27 ENCOUNTER — Other Ambulatory Visit: Payer: Self-pay | Admitting: Adult Health

## 2021-11-27 DIAGNOSIS — Z319 Encounter for procreative management, unspecified: Secondary | ICD-10-CM

## 2021-11-27 NOTE — Progress Notes (Signed)
Ck progesterone level 12/09/21

## 2022-01-06 LAB — PROGESTERONE: Progesterone: 7.8 ng/mL

## 2022-02-04 ENCOUNTER — Telehealth: Payer: Self-pay | Admitting: Orthopedic Surgery

## 2022-02-04 NOTE — Telephone Encounter (Signed)
Call received from patient inquiring about scheduling an appointment (02/03/22) - during time my computer system was freezing up. Returned call to offer appointment, left message.

## 2022-02-04 NOTE — Telephone Encounter (Signed)
Patient returned call; states the appointment is for her right knee, for which she was seen by one of the orthopaedists that go to [employer] in [place]. States had no Xray there, injection only.  Aware that the visit here would be 2nd opinion; therefore, notes from provider are needed for Dr to review and advise.

## 2022-02-09 ENCOUNTER — Ambulatory Visit (HOSPITAL_COMMUNITY): Payer: Medicaid Other | Admitting: Psychiatry

## 2022-02-18 ENCOUNTER — Ambulatory Visit (HOSPITAL_COMMUNITY): Payer: Medicaid Other | Admitting: Psychiatry

## 2022-02-18 ENCOUNTER — Encounter (HOSPITAL_COMMUNITY): Payer: Self-pay

## 2022-03-02 ENCOUNTER — Ambulatory Visit (HOSPITAL_COMMUNITY): Payer: Medicaid Other | Admitting: Psychiatry

## 2022-03-16 ENCOUNTER — Encounter (HOSPITAL_COMMUNITY): Payer: Self-pay | Admitting: Psychiatry

## 2022-03-16 ENCOUNTER — Ambulatory Visit (INDEPENDENT_AMBULATORY_CARE_PROVIDER_SITE_OTHER): Payer: Medicaid Other | Admitting: Psychiatry

## 2022-03-16 DIAGNOSIS — F1090 Alcohol use, unspecified, uncomplicated: Secondary | ICD-10-CM

## 2022-03-16 DIAGNOSIS — G4700 Insomnia, unspecified: Secondary | ICD-10-CM | POA: Insufficient documentation

## 2022-03-16 DIAGNOSIS — G4709 Other insomnia: Secondary | ICD-10-CM | POA: Diagnosis not present

## 2022-03-16 DIAGNOSIS — F1290 Cannabis use, unspecified, uncomplicated: Secondary | ICD-10-CM

## 2022-03-16 DIAGNOSIS — F411 Generalized anxiety disorder: Secondary | ICD-10-CM

## 2022-03-16 DIAGNOSIS — F410 Panic disorder [episodic paroxysmal anxiety] without agoraphobia: Secondary | ICD-10-CM | POA: Diagnosis not present

## 2022-03-16 DIAGNOSIS — F332 Major depressive disorder, recurrent severe without psychotic features: Secondary | ICD-10-CM | POA: Diagnosis not present

## 2022-03-16 DIAGNOSIS — Z87891 Personal history of nicotine dependence: Secondary | ICD-10-CM

## 2022-03-16 MED ORDER — SERTRALINE HCL 25 MG PO TABS: 12.5000 mg | ORAL_TABLET | Freq: Every day | ORAL | 0 refills | Status: AC

## 2022-03-16 NOTE — Progress Notes (Signed)
Psychiatric Initial Adult Assessment  Patient Identification: Kristen Jennings MRN:  [id number] Date of Evaluation:  03/16/2022 Referral Source: OB  Assessment:  Kristen Jennings is a 31 y.o. [phone]  female with a history of MDD with onset after 08-27-2014 death of her infant daughter Kristen Jennings) and subsequent SI with plan to overdose (no attempts due to friends staying with her), GAD with panic attacks, alcohol use disorder (5-10 shots daily), recent return to cannabis use after cousin and grandfather's deaths in 03/2022, 4 miscarriages in the last year, and history of tobacco use disorder in early remission who presents to [facility] via video conferencing for initial evaluation of depression and anxiety.  Patient reports being separated from her wife for the last year but running into issue of ex-wife not wanting a full divorce. In the meantime, she has continued to struggle with the death of her first daughter, Kristen Jennings, in August 27, 2014 after a pre-term delivery and death after making it home. She carries a significant amount of guilt related to this and while she has never attempted suicide, did have a plan that was not follow through with due to friends staying with her for 2-3 weeks immediately after Kristen Jennings's death. She is endorsing SI presently but is contracting for safety and has no intent/plan at present. See safety plan below. Other complicating social stressors for her include struggling in school, working several jobs to make ends meet, continuing to get messages from a recent ex who is with another woman but wants to have her children, recent deaths of her grandfather and cousin, and four miscarriages since December of 2022. She does qualify for a severe episode of major depression as well as generalized anxiety disorder with panic attacks; both of which could be contributing factors in miscarriages over the last year. Had direct discussion with patient about this as a risk factor in  trying to get pregnant and maintaining pregnacy moving forward. Plan on having longer discussion with patient, she had to leave for work prior to conclusion of session, around alcohol use disorder and cannabis use disorder as it pertains to her mood and health of fetus in the event of pregnancy. She does not have a history of withdrawal from alcohol but with drinking 5-10 shots daily, this is undoubtedly worsening her mood symptoms and increasing anger/irritability. Will need to game plan with her around how to safely discontinue the alcohol use; she denies having had withdrawal before.  Also consented her for zoloft use during pregnancy, discussed available data including: some studies show a higher chance of having babies with low birthweight and preterm delivery and difficulty in knowing if it is the medication, the untreated depression (or anxiety), or other factors that may increase the chance for these problems. Some, but not all, studies have suggested that when people who are pregnant take SSRIs during the second half of the pregnancy, their babies might have an increased chance for persistent pulmonary hypertension. Persistent pulmonary hypertension happens in 1 or 2 out of 1,000 births. Among the studies looking at this, the overall chance for pulmonary hypertension when an SSRI was used in pregnancy was less than 1/100 (less than 1%). For after birth baby might have irritability, jitteriness, tremors (shivering), constant crying, different sleep patterns, problems with eating and controlling body temperature, and some problems with breathing. In most cases, these symptoms are mild and go away within a couple weeks with no treatment.  For safety, she has the following acute risk factors for  suicide: current diagnosis of depression, alcohol use disorder, substance use disorder, social isolation, single parent, struggling with school, passive SI, and multiple recent familial deaths. Her chronic risk  factors for suicide are: history of SI with plan, chronic impulsivity, past substance use, chronic mental illness, anniversary of her daughter's death. Her protective factors are: minor children living in the home, religious prohibition against suicide, employment, going to school, beloved pets, supportive sister, actively seeking and engaging with mental/physical healthcare, planning for the future, and lack of intent/plan with SI. While future events cannot be fully predicted, patient does not meet IVC criteria and will continue as outpatient level of care.  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 r/o alcohol induced depressive disorder  Generalized anxiety disorder with panic attacks Past medication trials:  Status of problem: new to provider Interventions: -- start zoloft 12.5mg  daily (s11/14/23)  # Cannabis use disorder Past medication trials:  Status of problem: new to provider Interventions: -- continue to encourage abstinence  # Desiring pregnancy  Multiple miscarriages Past medication trials:  Status of problem: new to provider Interventions: -- consented for zoloft use as in assessment -- will need to stop alcohol use and cannabis use prior to becoming pregnant for health of fetus -- coordinate with OB if pregnant  # History of tobacco use disorder, in early remission Past medication trials:  Status of problem: new to provider Interventions: -- continue to encourage abstinence  Patient was given contact information for behavioral health clinic and was instructed to call 911 for emergencies.   Subjective:  Chief Complaint:  Chief Complaint  Patient presents with   Anxiety   Depression   Establish Care    History of Present Illness:  Goes by Johny Drilling  (pronounced Gregary Signs). Lives with daughter, Halo aged 5, but not getting along well. Also have dog, fish, turtles. Home body, so likes writing with aspirations of becoming a Clinical research associate, going to movies [facility]), art, sew but not having time to do anything for fun. Doesn't have resources to do it either, not having much support socially right now either. Is also in school which she is failing right now because of life stressors. 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 Going through a divorce. Buried her grandfather last Sep 06, 2022 and her cousin died yesterday. One of her closest friends also died in the summer. She had another daughter who died. Finds herself very lonely and emotional (predominantly angry) a lot of the time. Problems with falling asleep, staying asleep and waking up too early due to racing thoughts. Sometimes has nightmares, combination of flashbacks and vivid dreams.   Chronic worrier across multiple domains. Was diagnosed with depression and anxiety in 2016. Longest period of sleeplessness was after losing her first daughter, Kristen Jennings, in 2016 for 5 days. Had her at 23 weeks and survived for 5 months but one night she had a fair amount of milk and despite only sleeping for one hour, daughter had thrown up in her sleep and choked. Her adrenaline was up for the first couple of days and eventually took something to force her to sleep. Is a chronic excessive spender. No hypersexuality. Had a miscarriage right before Christmas of  last year then had miscarriages in April and May of 2023 and another one in June. These have affected her greatly. Has strong desire to be a mother of multiple children. Was evaluated after the June miscarriage and was told she doesn't produce enough progesterone so hasn't been able to carry past 6 weeks. Has had shorter period of 2-3 days with limited sleep more frequently.   Has noticed  day dreams but no hallucinations. Feels like she hears other people's thoughts but feels like people generally don't understand how she feels because of how she comes across. Gets angry easily. Very defensive around people. Frequent flashbacks. Avoidance behavior; this was the first year she celebrated Kristen Jennings's birthday; her death anniversary is coming up on the 21st.   Feels very alone and doesn't know how to keep people in her life. Doesn't want to harm herself because of wanting to be there for her daughter. Has thought about not being alive but doesn't want anyone else to take care of her daughter. Denies any attempts in the past, did have a fair amount of SI with daughter's death but friends stayed with her. Had plan to overdose on medication; friends stayed with her 2-3 weeks. Also discusses protective factor of God and her sister. With her wife, she isn't wanting a divorce but have been separated for 1 year. Discusses that she also likes men and that closest man is now in another relationship with another woman but telling her that he wants to have her children. Is very close with her sister.   Alcohol is about 5-10 shots per day. No withdrawal. No tobacco products, stopped smoking 6 months. Will hit a joint 3 times per day, just started again with recent deaths.   Past Psychiatric History:  Diagnoses: MDD, GAD Medication trials: clonazepam and others that she cannot remember the names of Previous psychiatrist/therapist: will assess at next visit Hospitalizations: none Suicide attempts: none SIB: will assess at next visit Hx of violence towards others: none Current access to guns: none Hx of abuse: will assess at next visit  Previous Psychotropic Medications: Yes   Substance Abuse History in the last 12 months:  Yes.    Past Medical History:  Past Medical History:  Diagnosis Date   Miscarriage    Pregnancy examination or test, negative result 11/09/2021   Routine Papanicolaou smear  11/09/2021    Past Surgical History:  Procedure Laterality Date   CESAREAN SECTION     2016, 2018    Family Psychiatric History: mom with depression/anxiety/bipolar  Family History:  Family History  Problem Relation Age of Onset   Bronchitis Maternal Grandmother    HIV/AIDS Maternal Grandfather    Drug abuse Father    Diabetes Mother    Hypertension Mother    Mental illness Mother    Hypertension Sister    Asthma Sister    Miscarriages / [place] Daughter     Social History:   Social History   Socioeconomic History   Marital status: Single    Spouse name: Not on file   Number of children: Not on file   Years of education: Not on file   Highest education level: Not on file  Occupational History   Not on file  Tobacco Use   Smoking status: Former    Types: Cigarettes   Smokeless tobacco: Never  Vaping Use   Vaping Use: Never used  Substance and Sexual Activity   Alcohol use: Yes    Comment: every other day  Drug use: Not Currently    Types: Marijuana   Sexual activity: Yes    Birth control/protection: Condom  Other Topics Concern   Not on file  Social History Narrative   Not on file   Social Determinants of Health   Financial Resource Strain: Not on file  Food Insecurity: Not on file  Transportation Needs: Not on file  Physical Activity: Not on file  Stress: Not on file  Social Connections: Not on file    Additional Social History: see HPI  Allergies:   Allergies  Allergen Reactions   Shellfish Allergy Anaphylaxis   Iodine     Current Medications: No current outpatient medications on file.   No current facility-administered medications for this visit.    ROS: Review of Systems  Psychiatric/Behavioral:  Positive for agitation, decreased concentration, dysphoric mood, sleep disturbance and suicidal ideas. The patient is nervous/anxious.     Objective:  Psychiatric Specialty Exam: There were no vitals taken for this visit.There is  no height or weight on file to calculate BMI.  General Appearance: Casual, Neat, Well Groomed, and wearing glasses. Appears older than stated age  Eye Contact:  Minimal  Speech:  Clear and Coherent and Normal Rate  Volume:  Normal  Mood:  Depressed  Affect:  Depressed, Tearful, and anxious and slightly irritable at times  Thought Content: Logical, Hallucinations: None, Ideas of Reference:   Paranoia, and Tangential   Suicidal Thoughts:  Yes.  without intent/plan  Homicidal Thoughts:  No  Thought Process:  Descriptions of Associations: Tangential  Orientation:  Full (Time, Place, and Person)    Memory:  Immediate;   Fair Recent;   Fair Remote;   Fair  Judgment:  Fair: drinking heavily but continues to work multiple jobs to support her daughte  Insight:  Fair  Concentration:  Concentration: Poor and Attention Span: Poor  Recall:  FiservFair  Fund of Knowledge: Fair  Language: Fair  Psychomotor Activity:  Increased and Restlessness  Akathisia:  No  AIMS (if indicated): not done  Assets:  Manufacturing systems engineerCommunication Skills Desire for Improvement Financial Resources/Insurance Housing Leisure Time Physical Health Resilience Social Support Talents/Skills Transportation Vocational/Educational  ADL's:  Intact  Cognition: WNL  Sleep:  Poor   PE: General: sits comfortably in view of camera; no acute distress  Pulm: no increased work of breathing on room air  MSK: all extremity movements appear intact  Neuro: no focal neurological deficits observed  Gait & Station: unable to assess by video    Metabolic Disorder Labs: No results found for: "HGBA1C", "MPG" No results found for: "PROLACTIN" No results found for: "CHOL", "TRIG", "HDL", "CHOLHDL", "VLDL", "LDLCALC" No results found for: "TSH"  Therapeutic Level Labs: No results found for: "LITHIUM" No results found for: "CBMZ" No results found for: "VALPROATE"  Screenings:  PHQ2-9    Flowsheet Row [facility]  PHQ-2 Total Score 6  PHQ-9 Total Score 19      Flowsheet Row [facility]  C-SSRS RISK CATEGORY Low Risk No Risk No Risk       Collaboration of Care: Collaboration of Care: Primary Care Provider AEB for alcohol use referral and Referral or follow-up with counselor/therapist AEB for DBT vs CBT  Patient/Guardian was advised Release of Information must be obtained prior to any record release in order to collaborate  their care with an outside provider. Patient/Guardian was advised if they have not already done so to contact the registration department to sign all necessary forms in order for [place] to release information regarding their care.   Consent: Patient/Guardian gives verbal consent for treatment and assignment of benefits for services provided during this visit. Patient/Guardian expressed understanding and agreed to proceed.   Televisit via video: I connected with JOLYNE LAYE on 03/16/22 at  1:00 PM EST by a video enabled telemedicine application and verified that I am speaking with the correct person using two identifiers.  Location: Patient: home in [place] Provider: home office   I discussed the limitations of evaluation and management by telemedicine and the availability of in person appointments. The patient expressed understanding and agreed to proceed.  I discussed the assessment and treatment plan with the patient. The patient was provided an opportunity to ask questions and all were answered. The patient agreed with the plan and demonstrated an understanding of the instructions.   The patient was advised to call back or seek an in-person evaluation if the symptoms worsen or if the condition fails to improve as anticipated.  I provided 60 minutes  of non-face-to-face time during this encounter.  Elsie Lincoln, MD 11/14/20231:56 PM

## 2022-03-16 NOTE — Patient Instructions (Addendum)
We discussed getting started on zoloft (sertraline) today in session. Please start taking sertraline at 12.5mg  (half a tablet) once daily. When we meet next, we will continue to discuss the positive safety profile of zoloft in pregnancy and breastfeeding and how untreated anxiety/depression in pregnancy are their own risk factors for miscarriage, pre-term birth, and birth defects. If you would like to learn more prior to our next appointment, please go to this website: PhysicalDelivery.ca [email or web address]  One item that we will also be discussing in more depth at our next visit is finding the safest path toward getting off of the alcohol. We have a lot of data to suggest that alcohol consumption during pregnancy isn't safe for the fetus and we know that heavy alcohol consumption can worsen depression, anxiety, and insomnia. We can game plan for stopping the marijuana use as well for similar reasons as alcohol. FullVenture.com.cy [email or web address]://www.richardson.info/

## 2022-03-31 ENCOUNTER — Encounter (HOSPITAL_COMMUNITY): Payer: Self-pay | Admitting: Psychiatry

## 2022-03-31 ENCOUNTER — Encounter (HOSPITAL_COMMUNITY): Payer: Self-pay

## 2022-03-31 ENCOUNTER — Telehealth (INDEPENDENT_AMBULATORY_CARE_PROVIDER_SITE_OTHER): Payer: Medicaid Other | Admitting: Psychiatry

## 2022-03-31 DIAGNOSIS — F411 Generalized anxiety disorder: Secondary | ICD-10-CM | POA: Diagnosis not present

## 2022-03-31 DIAGNOSIS — F4310 Post-traumatic stress disorder, unspecified: Secondary | ICD-10-CM

## 2022-03-31 DIAGNOSIS — F1090 Alcohol use, unspecified, uncomplicated: Secondary | ICD-10-CM | POA: Diagnosis not present

## 2022-03-31 DIAGNOSIS — Z319 Encounter for procreative management, unspecified: Secondary | ICD-10-CM

## 2022-03-31 DIAGNOSIS — G4709 Other insomnia: Secondary | ICD-10-CM

## 2022-03-31 DIAGNOSIS — F410 Panic disorder [episodic paroxysmal anxiety] without agoraphobia: Secondary | ICD-10-CM

## 2022-03-31 DIAGNOSIS — F1290 Cannabis use, unspecified, uncomplicated: Secondary | ICD-10-CM

## 2022-03-31 DIAGNOSIS — F332 Major depressive disorder, recurrent severe without psychotic features: Secondary | ICD-10-CM | POA: Diagnosis not present

## 2022-03-31 NOTE — Patient Instructions (Addendum)
Try calling [facility] at [phone] for help with coming off of alcohol. They may have psychotherapy services you can utilize as well. You can also try calling [facility] at [phone] for psychotherapy services. If you are desiring to try medication for your mood, give [place] a call back to schedule with Dr. Adrian Blackwater.
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
## 2022-04-02 ENCOUNTER — Encounter: Payer: Self-pay | Admitting: Orthopedic Surgery

## 2022-04-02 ENCOUNTER — Ambulatory Visit: Payer: Medicaid Other | Admitting: Orthopedic Surgery

## 2022-04-02 VITALS — BP 132/88 | HR 94 | Ht 64.0 in | Wt 263.0 lb

## 2022-04-02 DIAGNOSIS — G8929 Other chronic pain: Secondary | ICD-10-CM | POA: Diagnosis not present

## 2022-04-02 DIAGNOSIS — M25561 Pain in right knee: Secondary | ICD-10-CM | POA: Diagnosis not present

## 2022-04-02 NOTE — Progress Notes (Signed)
New Patient Visit  Assessment: Kristen Jennings is a 31 y.o. female with the following: 1. Chronic pain of right knee  Plan: Kristen Jennings has chronic and diffuse right knee pain.  No injury recently or in the past.  This has been ongoing for several years.  On physical exam, she does demonstrate a valgus alignment, with some hyperextension at the knee.  She has pain in the front of the knee, as well as medial and lateral, distal to the joint line.  Based on my evaluation, it is unclear what is causing her discomfort.  She does not describe a lot of falls, stating her knee buckles on a regular basis.  I think she could benefit from physical therapy, this could improve her strength.  She was also fitted for a new brace today.  She was clearly frustrated with her current course, and is interested in an MRI.  I stressed the importance of attempting physical therapy first.  She states her understanding.  I will see her back in 2 months.  If she has issues before then, she should return to clinic.  Follow-up: Return in about 2 months (around 06/03/2022).  Subjective:  Chief Complaint  Patient presents with   New Patient (Initial Visit)   Knee Pain    RT knee/had injection in August-helped for maybe a month    History of Present Illness: Kristen Jennings is a 31 y.o. female who presents for evaluation of right knee pain.  She states she has had pain in her right knee for several years.  No specific injury.  No remote history of an injury to her right knee.  She was recently seen by Dr. Cleophas Dunker and an injection was completed.  This provided pain relief for less than a month.  She states that she has difficulty standing for long periods of time.  She notes that her knee buckles on a regular basis.  This is caused her to fall.  Over-the-counter medications have not been helpful.  Ibuprofen is not providing sufficient relief.  She denies swelling in her right knee.  She states that she was told that  she has issues that are related to her anatomy.  She also states that she has been told she has arthritis.  She has not worked with physical therapy.  She has been wearing a brace, but it is getting worn out.   Review of Systems: No fevers or chills No numbness or tingling No chest pain No shortness of breath No bowel or bladder dysfunction No GI distress No headaches   Medical History:  Past Medical History:  Diagnosis Date   Miscarriage    Pregnancy examination or test, negative result 11/09/2021   Routine Papanicolaou smear 11/09/2021    Past Surgical History:  Procedure Laterality Date   CESAREAN SECTION     2016, 2018    Family History  Problem Relation Age of Onset   Bronchitis Maternal Grandmother    HIV/AIDS Maternal Grandfather    Drug abuse Father    Diabetes Mother    Hypertension Mother    Mental illness Mother    Hypertension Sister    Asthma Sister    Miscarriages / [place] Daughter    Social History   Tobacco Use   Smoking status: Former    Types: Cigarettes   Smokeless tobacco: Never  Vaping Use   Vaping Use: Never used  Substance Use Topics   Alcohol use: Yes    Comment: 5-10 shots  daily   Drug use: Yes    Types: Marijuana    Comment: hit joint 3 times daily    Allergies  Allergen Reactions   Shellfish Allergy Anaphylaxis   Iodine     Current Meds  Medication Sig   ibuprofen (ADVIL) 800 MG tablet Take 800 mg by mouth every 8 (eight) hours as needed (for pain).    Objective: BP 132/88   Pulse 94   Ht 5\' 4"  (1.626 m)   Wt 263 lb (119.3 kg)   SpO2 98%   BMI 45.14 kg/m   Physical Exam:  General: Alert and oriented., No acute distress., and Obese female. Gait: Right sided antalgic gait.  Valuation of the right knee demonstrates a valgus alignment overall.  Approximately 10 degrees or more hyperextension.  She is able to maintain a straight leg raise.  No effusion in her knee.  No increased laxity varus valgus stress.   Tenderness to palpation in the area of the Pes anserine tendons, as well as the lateral, proximal tibia.  Negative Lachman.  Mild tenderness to palpation around the patella.  IMAGING: I personally reviewed images previously obtained in clinic  IMPRESSION: 1. Borderline mild patella alta. 2. Minimal peripheral lateral compartment degenerative osteophytosis. 3. Minimal chronic enthesopathic change at the quadriceps insertion on the patella. 4. Ossification from remote injury at the medial collateral ligament origin off the medial femoral condyle.   New Medications:  No orders of the defined types were placed in this encounter.     , MD  04/02/2022 11:41 AM

## 2022-04-02 NOTE — Patient Instructions (Signed)
Recommend physical therapy for your knee   Wear the brace as needed for your right knee   Medications like tylenol and ibuprofen are appropriate.  Can also consider topical treatments like sprays, creams and patches.  Many of these are available over the counter

## 2022-04-29 ENCOUNTER — Ambulatory Visit (HOSPITAL_COMMUNITY): Payer: Medicaid Other | Attending: Orthopedic Surgery

## 2022-04-29 NOTE — Therapy (Incomplete)
OUTPATIENT PHYSICAL THERAPY LOWER EXTREMITY EVALUATION   Patient Name: Kristen Jennings MRN: [id number] DOB:09-18-1990, 31 y.o., female Today's Date: 04/29/2022  END OF SESSION:   Past Medical History:  Diagnosis Date   Miscarriage    Pregnancy examination or test, negative result 11/09/2021   Routine Papanicolaou smear 11/09/2021   Past Surgical History:  Procedure Laterality Date   CESAREAN SECTION     2016, 2018   Patient Active Problem List   Diagnosis Date Noted   PTSD (post-traumatic stress disorder) 03/31/2022   Alcohol use disorder 03/16/2022   Major depressive disorder, recurrent episode, severe (HCC) 03/16/2022   Generalized anxiety disorder with panic attacks 03/16/2022   Insomnia 03/16/2022   Cannabis use disorder 03/16/2022   History of tobacco use disorder 03/16/2022   History of recurrent miscarriages, not currently pregnant 11/09/2021   Patient desires pregnancy 11/09/2021    PCP: Marylynn Pearson FNP  REFERRING PROVIDER: Oliver Barre, MD  REFERRING DIAG: [id number] (ICD-10-CM) - Chronic pain of right knee  THERAPY DIAG:  No diagnosis found.  Rationale for Evaluation and Treatment: Rehabilitation  ONSET DATE: Chronic  SUBJECTIVE:   SUBJECTIVE STATEMENT: ***  PERTINENT HISTORY: Anxiety, MDD PAIN:  Are you having pain? {OPRCPAIN:27236}  PRECAUTIONS: None  WEIGHT BEARING RESTRICTIONS: No  FALLS:  Has patient fallen in last 6 months? {fallsyesno:27318}  LIVING ENVIRONMENT: Lives with: {OPRC lives with:25569::"lives with their family"} Lives in: {Lives in:25570} Stairs: {opstairs:27293} Has following equipment at home: {Assistive devices:23999}  OCCUPATION: ***  PLOF: {PLOF:24004}  PATIENT GOALS: ***  NEXT MD VISIT: ***  OBJECTIVE:   DIAGNOSTIC FINDINGS: XR 11/25/21 IMPRESSION: 1. Borderline mild patella alta. 2. Minimal peripheral lateral compartment degenerative osteophytosis. 3. Minimal chronic enthesopathic  change at the quadriceps insertion on the patella. 4. Ossification from remote injury at the medial collateral ligament origin off the medial femoral condyle  PATIENT SURVEYS: {rehab surveys:24030}  COGNITION: Overall cognitive status: {cognition:24006}     SENSATION: {sensation:27233}  EDEMA:  {edema:24020}  MUSCLE LENGTH: Hamstrings: Right *** deg; Left *** deg Maisie Fus test: Right *** deg; Left *** deg  POSTURE: {posture:25561}  PALPATION: ***  LOWER EXTREMITY ROM:  Active ROM Right eval Left eval  Hip flexion    Hip extension    Hip abduction    Hip adduction    Hip internal rotation    Hip external rotation    Knee flexion    Knee extension    Ankle dorsiflexion    Ankle plantarflexion    Ankle inversion    Ankle eversion     (Blank rows = not tested)  LOWER EXTREMITY MMT:  MMT Right eval Left eval  Hip flexion    Hip extension    Hip abduction    Hip adduction    Hip internal rotation    Hip external rotation    Knee flexion    Knee extension    Ankle dorsiflexion    Ankle plantarflexion    Ankle inversion    Ankle eversion     (Blank rows = not tested)  LOWER EXTREMITY SPECIAL TESTS:  {LEspecialtests:26242}  FUNCTIONAL TESTS:  {Functional tests:24029}  GAIT: Distance walked: *** Assistive device utilized: {Assistive devices:23999} Level of assistance: {Levels of assistance:24026} Comments: ***   TODAY'S TREATMENT:  DATE:  04/29/22 ***    PATIENT EDUCATION:  Education details: Patient educated on exam findings, POC, scope of PT, HEP, and ***. Person educated: Patient Education method: Explanation, Demonstration, and Handouts Education comprehension: verbalized understanding, returned demonstration, verbal cues required, and tactile cues required  HOME EXERCISE PROGRAM: ***  ASSESSMENT:  CLINICAL  IMPRESSION: Patient a 31 y.o. y.o. female who was seen today for physical therapy evaluation and treatment for Chronic R knee pain. Patient presents with pain limited deficits in R knee strength, ROM, endurance, activity tolerance, and functional mobility with ADL. Patient is having to modify and restrict ADL as indicated by outcome measure score as well as subjective information and objective measures which is affecting overall participation. Patient will benefit from skilled physical therapy in order to improve function and reduce impairment.  OBJECTIVE IMPAIRMENTS: Abnormal gait, decreased activity tolerance, decreased balance, decreased endurance, decreased mobility, difficulty walking, decreased ROM, decreased strength, impaired flexibility, improper body mechanics, and pain.   ACTIVITY LIMITATIONS: carrying, lifting, bending, standing, squatting, stairs, transfers, locomotion level, and caring for others  PARTICIPATION LIMITATIONS: meal prep, cleaning, laundry, shopping, community activity, occupation, and yard work  PERSONAL FACTORS: Fitness, Time since onset of injury/illness/exacerbation, and 1-2 comorbidities: anxiety, MDD, increased BMI  are also affecting patient's functional outcome.   REHAB POTENTIAL: {rehabpotential:25112}  CLINICAL DECISION MAKING: {clinical decision making:25114}  EVALUATION COMPLEXITY: {Evaluation complexity:25115}   GOALS: Goals reviewed with patient? Yes  SHORT TERM GOALS: Target date: ***  Patient will be independent with HEP in order to improve functional outcomes. Baseline: Goal status: INITIAL  2.  Patient will report at least 25% improvement in symptoms for improved quality of life. Baseline: Goal status: INITIAL  3.  *** Baseline: *** Goal status: {GOALSTATUS:25110}  4.  *** Baseline: *** Goal status: {GOALSTATUS:25110}  5.  *** Baseline: *** Goal status: {GOALSTATUS:25110}  6.  *** Baseline: *** Goal status:  {GOALSTATUS:25110}  LONG TERM GOALS: Target date: ***  Patient will report at least 75% improvement in symptoms for improved quality of life. Baseline:  Goal status: INITIAL  2.  Patient will improve FOTO score by at least *** points in order to indicate improved tolerance to activity. Baseline: *** Goal status: INITIAL  3.  Patient will be able to navigate stairs with reciprocal pattern without compensation in order to demonstrate improved LE strength. Baseline: *** Goal status: INITIAL  4.  Patient will be able to ambulate at least *** feet in in order to demonstrate improved tolerance to activity. Baseline: *** Goal status: INITIAL  5.  Patient will be able to complete 5x STS in under 11.4 seconds in order to reduce the risk of falls. Baseline: *** Goal status: INITIAL  6.  Patient will demonstrate grade of 5/5 MMT grade in all tested musculature as evidence of improved strength to assist with stair ambulation and gait.   Baseline: *** Goal status: INITIAL   PLAN:  PT FREQUENCY: {rehab frequency:25116}  PT DURATION: {rehab duration:25117}  PLANNED INTERVENTIONS: Therapeutic exercises, Therapeutic activity, Neuromuscular re-education, Balance training, Gait training, Patient/Family education, Joint manipulation, Joint mobilization, Stair training, Orthotic/Fit training, DME instructions, Aquatic Therapy, Dry Needling, Electrical stimulation, Spinal manipulation, Spinal mobilization, Cryotherapy, Moist heat, Compression bandaging, scar mobilization, Splintting, Taping, Traction, Ultrasound, Ionotophoresis 4mg /ml Dexamethasone, and Manual therapy  PLAN FOR NEXT SESSION: ***   , PT 04/29/2022, 9:51 AM

## 2022-05-13 ENCOUNTER — Ambulatory Visit (HOSPITAL_COMMUNITY): Payer: Medicaid Other | Admitting: Clinical

## 2022-05-25 ENCOUNTER — Ambulatory Visit (HOSPITAL_COMMUNITY): Payer: Medicaid Other

## 2022-05-27 ENCOUNTER — Other Ambulatory Visit: Payer: Self-pay

## 2022-05-27 ENCOUNTER — Ambulatory Visit (HOSPITAL_COMMUNITY): Payer: Medicaid Other | Attending: Orthopedic Surgery

## 2022-05-27 DIAGNOSIS — G8929 Other chronic pain: Secondary | ICD-10-CM | POA: Insufficient documentation

## 2022-05-27 DIAGNOSIS — M25561 Pain in right knee: Secondary | ICD-10-CM | POA: Insufficient documentation

## 2022-05-27 DIAGNOSIS — R262 Difficulty in walking, not elsewhere classified: Secondary | ICD-10-CM | POA: Diagnosis present

## 2022-05-27 NOTE — Therapy (Signed)
OUTPATIENT PHYSICAL THERAPY LOWER EXTREMITY EVALUATION   Patient Name: Kristen Jennings MRN: [id number] DOB:1990/05/30, 32 y.o., female Today's Date: 05/27/2022  END OF SESSION:  PT End of Session - 05/27/22 0912     Visit Number 1    Number of Visits 8    Date for PT Re-Evaluation 06/25/22    Authorization Type Medicaid health blue; please check auth    PT Start Time 0909    PT Stop Time 0940    PT Time Calculation (min) 31 min    Activity Tolerance Patient tolerated treatment well    Behavior During Therapy [facility] for tasks assessed/performed             Past Medical History:  Diagnosis Date   Miscarriage    Pregnancy examination or test, negative result 11/09/2021   Routine Papanicolaou smear 11/09/2021   Past Surgical History:  Procedure Laterality Date   CESAREAN SECTION     2016, 2018   Patient Active Problem List   Diagnosis Date Noted   PTSD (post-traumatic stress disorder) 03/31/2022   Alcohol use disorder 03/16/2022   Major depressive disorder, recurrent episode, severe ([place]) 03/16/2022   Generalized anxiety disorder with panic attacks 03/16/2022   Insomnia 03/16/2022   Cannabis use disorder 03/16/2022   History of tobacco use disorder 03/16/2022   History of recurrent miscarriages, not currently pregnant 11/09/2021   Patient desires pregnancy 11/09/2021    PCP: Denyce Robert, FNP  REFERRING PROVIDER: Larena Glassman, MD  REFERRING DIAG: [phone] (ICD-10-CM) - Chronic pain of right knee  THERAPY DIAG:  Chronic pain of right knee  Difficulty in walking, not elsewhere classified  Rationale for Evaluation and Treatment: Rehabilitation  ONSET DATE: 8 years or more  SUBJECTIVE:   SUBJECTIVE STATEMENT: Pain for many years; was going to [facility] orthopedic in [place]; had an injection in the right knee there in August.  Wanted to see an MD closer to home so saw Dr. Amedeo Kinsman.  Injection helped for a little while; maybe a month and a half; but did  not take the pain away completely and now has returned to prior level of pain; Dr. Amedeo Kinsman referred to therapy. Normally wears a brace on right leg; tends to weight bear on left side  PERTINENT HISTORY: Injection in the knee back in august PAIN:  Are you having pain? Yes: NPRS scale: 9/10 Pain location: right knee Pain description: sore, numb, aching Aggravating factors: standing, putting pressure Relieving factors: rest, sitting down  PRECAUTIONS: None  WEIGHT BEARING RESTRICTIONS: No  FALLS:  Has patient fallen in last 6 months? Yes. 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 Amedeo Kinsman  OBJECTIVE:   DIAGNOSTIC FINDINGS: IMPRESSION: 1. Borderline mild patella alta. 2. Minimal peripheral lateral compartment degenerative osteophytosis. 3. Minimal chronic enthesopathic change at the quadriceps insertion on the patella. 4. Ossification from remote injury at the medial collateral ligament origin off the medial femoral condyle.  PATIENT SURVEYS:  FOTO 16  COGNITION: Overall cognitive status: Within functional limits for tasks assessed     SENSATION: WFL  EDEMA:  Minimal noted    PALPATION: Lateral tilting patella right; general soreness medial and lateral right knee joint line  LOWER EXTREMITY ROM:     Active  Right eval Left eval  Hip flexion    Hip extension    Hip abduction    Hip adduction    Hip internal rotation    Hip external rotation    Knee flexion 130 134  Knee extension -8 0 (hyperextends)  Ankle dorsiflexion    Ankle plantarflexion    Ankle inversion    Ankle eversion     (Blank rows = not tested)  LOWER EXTREMITY MMT:  MMT Right eval Left eval  Hip flexion 4- 5  Hip  extension    Hip abduction    Hip adduction    Hip internal rotation    Hip external rotation    Knee flexion 3- 5  Knee extension 4- 5  Ankle dorsiflexion 4 5  Ankle plantarflexion    Ankle inversion    Ankle eversion     (Blank rows = not tested)  FUNCTIONAL TESTS:  5 times sit to stand: 23.62 sec ; noted weightbearing left leg > right  GAIT: Distance walked: 50 ft Assistive device utilized: None Level of assistance: Modified independence Comments: antalgic gait; slower than normal gait speed   TODAY'S TREATMENT:                                                                                                                              DATE: 05/27/22 physical therapy evaluation and HEP instruction ; trial of kinesiotape to right patella to correct lateral tilt; 1 piece   PATIENT EDUCATION:  Education details: Patient educated on exam findings, POC, scope of PT, HEP, and what to expect next visit. Person educated: Patient Education method: Explanation, Demonstration, and Handouts Education comprehension: verbalized understanding, returned demonstration, verbal cues required, and tactile cues required   HOME EXERCISE PROGRAM: Access Code: [email or web address] URL: [email or web address] Date: 05/27/2022 Prepared by: [facility] - Rehab  Exercises - Supine Quad Set  - 5 x daily - 7 x weekly - 1 sets - 10 reps - 5 sec hold  ASSESSMENT:  CLINICAL IMPRESSION: Patient is a 32 y.o. female who was seen today for physical therapy evaluation and treatment for chronic right knee pain. Patient presents to physical therapy with complaint of right knee pain. Patient demonstrates muscle weakness, reduced ROM, and fascial restrictions which are likely contributing to symptoms of pain and are negatively impacting patient ability to perform ADLs and functional mobility tasks. Patient will benefit from skilled physical therapy services to address these deficits to reduce pain and improve level of  function with ADLs and functional mobility tasks.   OBJECTIVE IMPAIRMENTS: Abnormal gait, decreased activity tolerance, decreased knowledge of condition, decreased mobility, difficulty walking, decreased ROM, decreased strength, hypomobility, increased fascial restrictions, impaired perceived functional ability, impaired flexibility, and pain.   ACTIVITY LIMITATIONS: carrying, lifting, bending, sitting, standing, squatting, sleeping, stairs, transfers, locomotion level, and caring for others  PARTICIPATION LIMITATIONS: meal prep, cleaning, laundry, shopping, community activity, occupation, and yard work  PERSONAL FACTORS: Time since onset of injury/illness/exacerbation  are also affecting patient's functional outcome.   REHAB POTENTIAL: Good  CLINICAL DECISION MAKING: Stable/uncomplicated  EVALUATION COMPLEXITY: Low   GOALS: Goals reviewed with patient? No  SHORT TERM GOALS: Target date: 06/11/2022 patient will be independent with initial HEP  Baseline: Goal status: INITIAL  2.  Patient will self report 30% improvement to improve tolerance for functional activity  Baseline:  Goal status: INITIAL   LONG TERM GOALS: Target date: 06/25/2022  Patient will be independent in self management strategies to improve quality of life and functional outcomes.  Baseline:  Goal status: INITIAL  2.  Patient will self report 50% improvement to improve tolerance for functional activity  Baseline:  Goal status: INITIAL  3.  Patient will improve FOTO score to predicted value   Baseline: 16 Goal status: INITIAL  4.  Patient will increase  right leg MMTs to 4+-5/5 without pain to promote return to ambulation community distances with minimal deviation.  Baseline:  Goal status: INITIAL  5.  Patient will increased right knee extension to 0 to improve ability to stand with equal weightbearing while standing for job tasks Baseline:  Goal status: INITIAL     PLAN:  PT FREQUENCY:  2x/week  PT DURATION: 4 weeks  PLANNED INTERVENTIONS: Therapeutic exercises, Therapeutic activity, Neuromuscular re-education, Balance training, Gait training, Patient/Family education, Joint manipulation, Joint mobilization, Stair training, Orthotic/Fit training, DME instructions, Aquatic Therapy, Dry Needling, Electrical stimulation, Spinal manipulation, Spinal mobilization, Cryotherapy, Moist heat, Compression bandaging, scar mobilization, Splintting, Taping, Traction, Ultrasound, Ionotophoresis 4mg /ml Dexamethasone, and Manual therapy   PLAN FOR NEXT SESSION: Review HEP and goals; progress strengthening and mobility right knee   10:07 AM, 05/27/22 Griffith Santilli Small Vedha Tercero MPT [facility] [phone] Ph:[phone]

## 2022-06-01 ENCOUNTER — Ambulatory Visit (HOSPITAL_COMMUNITY): Payer: Medicaid Other

## 2022-06-04 ENCOUNTER — Ambulatory Visit: Payer: Medicaid Other | Admitting: Orthopedic Surgery

## 2022-06-08 ENCOUNTER — Encounter (HOSPITAL_COMMUNITY): Payer: Medicaid Other

## 2022-06-10 ENCOUNTER — Encounter (HOSPITAL_COMMUNITY): Payer: Medicaid Other

## 2022-06-14 ENCOUNTER — Emergency Department (HOSPITAL_COMMUNITY): Payer: Medicaid Other

## 2022-06-14 ENCOUNTER — Encounter (HOSPITAL_COMMUNITY): Payer: Self-pay | Admitting: Emergency Medicine

## 2022-06-14 ENCOUNTER — Other Ambulatory Visit: Payer: Self-pay

## 2022-06-14 ENCOUNTER — Emergency Department (HOSPITAL_COMMUNITY)
Admission: EM | Admit: 2022-06-14 | Discharge: 2022-06-14 | Disposition: A | Discharge status: Home / Self Care | Payer: Medicaid Other | Attending: Emergency Medicine | Admitting: Emergency Medicine

## 2022-06-14 DIAGNOSIS — J36 Peritonsillar abscess: Secondary | ICD-10-CM | POA: Diagnosis not present

## 2022-06-14 DIAGNOSIS — M542 Cervicalgia: Secondary | ICD-10-CM | POA: Insufficient documentation

## 2022-06-14 DIAGNOSIS — H9201 Otalgia, right ear: Secondary | ICD-10-CM | POA: Diagnosis not present

## 2022-06-14 DIAGNOSIS — J029 Acute pharyngitis, unspecified: Secondary | ICD-10-CM | POA: Diagnosis present

## 2022-06-14 DIAGNOSIS — M791 Myalgia, unspecified site: Secondary | ICD-10-CM | POA: Diagnosis not present

## 2022-06-14 LAB — I-STAT CHEM 8, ED
BUN: 8 mg/dL (ref 6–20)
Calcium, Ion: 1.12 mmol/L — ABNORMAL LOW (ref 1.15–1.40)
Chloride: 103 mmol/L (ref 98–111)
Creatinine, Ser: 0.7 mg/dL (ref 0.44–1.00)
Glucose, Bld: 95 mg/dL (ref 70–99)
HCT: 46 % (ref 36.0–46.0)
Hemoglobin: 15.6 g/dL — ABNORMAL HIGH (ref 12.0–15.0)
Potassium: 3.9 mmol/L (ref 3.5–5.1)
Sodium: 140 mmol/L (ref 135–145)
TCO2: 27 mmol/L (ref 22–32)

## 2022-06-14 LAB — BASIC METABOLIC PANEL
Anion gap: 8 (ref 5–15)
BUN: 9 mg/dL (ref 6–20)
CO2: 24 mmol/L (ref 22–32)
Calcium: 8.7 mg/dL — ABNORMAL LOW (ref 8.9–10.3)
Chloride: 105 mmol/L (ref 98–111)
Creatinine, Ser: 0.67 mg/dL (ref 0.44–1.00)
GFR, Estimated: >60 mL/min (ref >60)
Glucose, Bld: 95 mg/dL (ref 70–99)
Potassium: 3.7 mmol/L (ref 3.5–5.1)
Sodium: 137 mmol/L (ref 135–145)

## 2022-06-14 LAB — CBC
HCT: 45.2 % (ref 36.0–46.0)
Hemoglobin: 14.9 g/dL (ref 12.0–15.0)
MCH: 29.9 pg (ref 26.0–34.0)
MCHC: 33 g/dL (ref 30.0–36.0)
MCV: 90.8 fL (ref 80.0–100.0)
Platelets: 303 10*3/uL (ref 150–400)
RBC: 4.98 MIL/uL (ref 3.87–5.11)
RDW: 13.2 % (ref 11.5–15.5)
WBC: 13.6 10*3/uL — ABNORMAL HIGH (ref 4.0–10.5)
nRBC: 0 % (ref 0.0–0.2)

## 2022-06-14 MED ORDER — MORPHINE SULFATE (PF) 4 MG/ML IV SOLN
4.0000 mg | Freq: Once | INTRAVENOUS | Status: AC
  Administered 2022-06-14: 4 mg via INTRAVENOUS
  Filled 2022-06-14: qty 1

## 2022-06-14 MED ORDER — IOHEXOL 300 MG/ML  SOLN
75.0000 mL | Freq: Once | INTRAMUSCULAR | Status: AC | PRN
  Administered 2022-06-14: 75 mL via INTRAVENOUS

## 2022-06-14 MED ORDER — CLINDAMYCIN HCL 300 MG PO CAPS: 300.0000 mg | ORAL_CAPSULE | Freq: Three times a day (TID) | ORAL | 0 refills | Status: AC

## 2022-06-14 MED ORDER — DEXAMETHASONE SODIUM PHOSPHATE 10 MG/ML IJ SOLN
10.0000 mg | Freq: Once | INTRAMUSCULAR | Status: AC
  Administered 2022-06-14: 10 mg via INTRAVENOUS
  Filled 2022-06-14: qty 1

## 2022-06-14 MED ORDER — CLINDAMYCIN PHOSPHATE 900 MG/50ML IV SOLN
900.0000 mg | Freq: Once | INTRAVENOUS | Status: AC
  Administered 2022-06-14: 900 mg via INTRAVENOUS
  Filled 2022-06-14: qty 50

## 2022-06-14 MED ORDER — METHYLPREDNISOLONE 4 MG PO TBPK: ORAL_TABLET | ORAL | 0 refills | Status: AC

## 2022-06-14 NOTE — Discharge Instructions (Signed)
Take the antibiotic and the Dosepak as directed until finished.  Please do not stop the medication prematurely.  You may take Tylenol every 4 hours if needed for fever and/or bodyaches.  Drink plenty of fluids, soft foods that are easy to swallow.  Please call the ENT provider listed to be seen in 48 hours for follow-up in her office if your symptoms are not improving.

## 2022-06-14 NOTE — ED Provider Notes (Signed)
[facility] Provider Note   CSN: [id number] Arrival date & time: 06/14/22  1016     History  Chief Complaint  Patient presents with   Sore Throat    Kristen Jennings is a 32 y.o. female.   Sore Throat Pertinent negatives include no chest pain, no abdominal pain, no headaches and no shortness of breath.       Kristen Jennings is a 32 y.o. female who presents to the Emergency Department complaining of sore throat, right ear pain, right neck pain, and bodyaches.  She describes throat pain x 3 weeks.  She was treating herself with over-the-counter medications initially.  She went to urgent care and was treated with amoxicillin for 10 days.  She took her last dose yesterday.  She denies any relief of her symptoms.  She returned to urgent care for reevaluation and was recommended to come to the emergency department for evaluation of possible peritonsillar abscess.  Patient notes difficulty swallowing, decreased appetite and muffled voice for 3 weeks.  She denies any current fever, abdominal pain, vomiting or diarrhea.  No chest pain or shortness of breath.  Home Medications Prior to Admission medications   Medication Sig Start Date End Date Taking? Authorizing Provider  ibuprofen (ADVIL) 800 MG tablet Take 800 mg by mouth every 8 (eight) hours as needed (for pain). 11/25/21   [provider]  sertraline (ZOLOFT) 25 MG tablet Take 0.5 tablets (12.5 mg total) by mouth daily. Patient not taking: Reported on 04/02/2022 03/16/22 05/15/22  Jacquelynn Cree, MD      Allergies    Shellfish allergy and Iodine    Review of Systems   Review of Systems  Constitutional:  Positive for appetite change. Negative for chills.  HENT:  Positive for ear pain, sore throat and trouble swallowing. Negative for congestion, facial swelling and sinus pressure.   Respiratory:  Negative for cough, shortness of breath and wheezing.   Cardiovascular:   Negative for chest pain.  Gastrointestinal:  Negative for abdominal pain, diarrhea, nausea and vomiting.  Genitourinary:  Negative for dysuria.  Musculoskeletal:  Positive for myalgias. Negative for back pain, neck pain and neck stiffness.  Skin:  Negative for rash.  Neurological:  Negative for dizziness, syncope, weakness, numbness and headaches.  Psychiatric/Behavioral:  Negative for confusion.     Physical Exam Updated Vital Signs BP 118/78 (BP Location: Right Arm)   Pulse 98   Temp 98.7 F (37.1 C) (Oral)   Resp 18   Ht 5' 4"$  (1.626 m)   Wt 119.3 kg   LMP 06/08/2022 (Exact Date)   SpO2 99%   BMI 45.14 kg/m  Physical Exam Vitals and nursing note reviewed.  Constitutional:      General: She is not in acute distress.    Appearance: She is well-developed.  HENT:     Right Ear: Ear canal normal.     Left Ear: Tympanic membrane and ear canal normal.     Ears:     Comments: Mild erythema of the right TM without bulging or perforation.  No edema of the ear canal.  No mastoid tenderness.    Mouth/Throat:     Mouth: Mucous membranes are moist.     Pharynx: Posterior oropharyngeal erythema present. No oropharyngeal exudate.     Comments: Swelling and bulging of the right soft palate.  Uvula midline nonedematous.  There is erythema of the oropharynx, no exudate.  No trismus Neck:  Meningeal: Kernig's sign absent.  Cardiovascular:     Rate and Rhythm: Normal rate and regular rhythm.     Pulses: Normal pulses.  Pulmonary:     Effort: Pulmonary effort is normal.     Breath sounds: Normal breath sounds.  Chest:     Chest wall: No tenderness.  Abdominal:     General: There is no distension.     Palpations: Abdomen is soft.     Tenderness: There is no abdominal tenderness.  Musculoskeletal:        General: Normal range of motion.     Cervical back: Normal range of motion. No rigidity.  Lymphadenopathy:     Head:     Right side of head: No preauricular or posterior  auricular adenopathy.     Cervical: Cervical adenopathy present.     Right cervical: Superficial cervical adenopathy and deep cervical adenopathy present.  Skin:    General: Skin is warm.     Capillary Refill: Capillary refill takes less than 2 seconds.  Neurological:     General: No focal deficit present.     Mental Status: She is alert.     Sensory: No sensory deficit.     Motor: No weakness.     ED Results / Procedures / Treatments   Labs (all labs ordered are listed, but only abnormal results are displayed) Labs Reviewed  CBC - Abnormal; Notable for the following components:      Result Value   WBC 13.6 (*)    All other components within normal limits  BASIC METABOLIC PANEL - Abnormal; Notable for the following components:   Calcium 8.7 (*)    All other components within normal limits  I-STAT CHEM 8, ED - Abnormal; Notable for the following components:   Calcium, Ion 1.12 (*)    Hemoglobin 15.6 (*)    All other components within normal limits  CULTURE, GROUP A STREP ([facility])  I-STAT BETA HCG BLOOD, ED ([facility], [facility], [facility] ONLY)    EKG None  Radiology CT Soft Tissue Neck W Contrast  Result Date: 06/14/2022 CLINICAL DATA:  Sore throat x3 weeks. Concern for peritonsillar abscess. EXAM: CT NECK WITH CONTRAST TECHNIQUE: Multidetector CT imaging of the neck was performed using the standard protocol following the bolus administration of intravenous contrast. RADIATION DOSE REDUCTION: This exam was performed according to the departmental dose-optimization program which includes automated exposure control, adjustment of the mA and/or kV according to patient size and/or use of iterative reconstruction technique. CONTRAST:  29m OMNIPAQUE IOHEXOL 300 MG/ML  SOLN COMPARISON:  None available. FINDINGS: Pharynx and larynx: Right-sided peritonsillar fluid collection measuring up to 1.8 x 1.3 x 0.9 cm (image 33 series 2 and coronal image 56 series 4). Salivary glands: No inflammation, mass, or stone.  Thyroid: Normal. Lymph nodes: Mildly prominent cervical lymph nodes, right-greater-than-left, favored to be reactive. Vascular: Unremarkable. Limited intracranial: Unremarkable. Visualized orbits: Unremarkable. Mastoids and visualized paranasal sinuses: Trace fluid in the left maxillary sinus. Skeleton: No suspicious bone lesions. Upper chest: Unremarkable. Other: None. IMPRESSION: 1. Right-sided peritonsillar abscess measuring up to 1.8 cm. 2. Mildly prominent cervical lymph nodes, right-greater-than-left, favored to be reactive. Electronically Signed   By: WEmmit AlexandersM.D.   On: 06/14/2022 13:30    Procedures Procedures    Medications Ordered in ED Medications  morphine (PF) 4 MG/ML injection 4 mg (4 mg Intravenous Given 06/14/22 1245)  iohexol (OMNIPAQUE) 300 MG/ML solution 75 mL (75 mLs Intravenous Contrast Given 06/14/22 1323)  ED Course/ Medical Decision Making/ A&P                             Medical Decision Making Patient here with 3-week history of sore throat.  Symptoms have been associated with fever at onset, now resolved.  Mild generalized bodyaches right ear pain and decreased appetite.  No abdominal pain, vomiting or diarrhea.  On my exam, patient nontoxic-appearing she does have a muffled voice, handling her secretions without difficulty.  No increased work of breathing.  Uvula is midline there is bulging of the soft palate on the right.  Mucous membranes are moist.  Clinically, suspect this is related to peritonsillar abscess, strep pharyngitis, retropharyngeal abscess also considered but felt less likely.  Amount and/or Complexity of Data Reviewed Labs: ordered.    Details: Labs interpreted by me, leukocytosis with white count of 13,600 chemistries without derangement Radiology: ordered.    Details: CT soft tissue neck ordered for further evaluation of patient's symptoms.  Shows 2 cm right-sided peritonsillar abscess. Discussion of management or test interpretation  with external provider(s): Discussed findings with on-call ENT, Dr. Fredric Dine who recommends IV Decadron and IV clindamycin here with prescription for Medrol Dosepak and clindamycin.  Recommends patient contact her office in 48 hours for follow-up if not improving for likely I&D  On recheck, patient resting comfortably.  Vital signs reviewed.  No worsening symptoms or complications after receiving IV medications.  She appears appropriate for discharge home  Risk Prescription drug management.           Final Clinical Impression(s) / ED Diagnoses Final diagnoses:  Peritonsillar abscess    Rx / DC Orders ED Discharge Orders     None         Bufford Lope 06/14/22 2346    Fransico Meadow, MD 06/16/22 1113

## 2022-06-14 NOTE — ED Triage Notes (Signed)
Pt sent from [facility] for possible peritonsillar abscess, sore throat x 3 weeks

## 2022-06-15 ENCOUNTER — Encounter (HOSPITAL_COMMUNITY): Payer: Medicaid Other

## 2022-06-16 LAB — CULTURE, GROUP A STREP (THRC)

## 2022-06-17 ENCOUNTER — Encounter (HOSPITAL_COMMUNITY): Payer: Medicaid Other

## 2022-06-18 LAB — I-STAT BETA HCG BLOOD, ED (MC, WL, AP ONLY): I-stat hCG, quantitative: <5 m[IU]/mL (ref <5)

## 2022-06-22 ENCOUNTER — Telehealth (HOSPITAL_COMMUNITY): Payer: Self-pay

## 2022-06-22 ENCOUNTER — Encounter (HOSPITAL_COMMUNITY): Payer: Medicaid Other

## 2022-06-22 NOTE — Telephone Encounter (Signed)
No show #1: Spoke with patient regarding missed appointment this morning.  Patient states she has been sick; flu.  Also states she has an appointment with an ENT to discuss removing tonsils on Thursday so will cancel that appointment.  The next appointment will be next Tuesday. Instructed patient to call if she cannot make it and she verbalizes understanding.   9:57 AM, 06/22/22 Tayshun Gappa Small Rielyn Krupinski MPT [facility] [place] [phone]

## 2022-06-24 ENCOUNTER — Encounter (HOSPITAL_COMMUNITY): Payer: Medicaid Other

## 2022-06-29 ENCOUNTER — Encounter (HOSPITAL_COMMUNITY): Payer: Medicaid Other

## 2022-06-29 ENCOUNTER — Encounter (HOSPITAL_COMMUNITY): Payer: Self-pay

## 2022-06-29 NOTE — Therapy (Signed)
PHYSICAL THERAPY DISCHARGE SUMMARY  Visits from Start of Care: 1  Current functional level related to goals / functional outcomes: na   Remaining deficits: na   Education / Equipment: na   Patient agrees to discharge. Patient goals were not met. Patient is being discharged due to not returning since the last visit.  8:33 AM, 06/29/22 Georgine Wiltse Small Teren Franckowiak MPT [facility] [place] [phone]

## 2022-07-01 ENCOUNTER — Encounter (HOSPITAL_COMMUNITY): Payer: Medicaid Other

## 2022-07-01 ENCOUNTER — Ambulatory Visit (HOSPITAL_COMMUNITY): Payer: Medicaid Other | Admitting: Clinical

## 2022-07-01 ENCOUNTER — Telehealth (HOSPITAL_COMMUNITY): Payer: Self-pay | Admitting: Clinical

## 2022-07-01 ENCOUNTER — Encounter: Payer: Self-pay | Admitting: Radiology

## 2022-07-01 NOTE — Telephone Encounter (Signed)
Patient was a no show for this scheduled in person [facility] appointment.

## 2023-11-18 IMAGING — US US OB TRANSVAGINAL
1 series · 14 of 28 positions shown · non-contrast
Comparison: CT abdomen and pelvis 08/10/2020

CLINICAL DATA: Vaginal bleeding. Left lower quadrant abdominal pain
for 1 day.

EXAM:
OBSTETRIC <14 WK ULTRASOUND
TECHNIQUE: Transabdominal ultrasound was performed for evaluation of the
gestation as well as the maternal uterus and adnexal regions.

[Series 1: us ob transvaginal · 97 acquisitions, 14 frames shown]
[im 4/97]
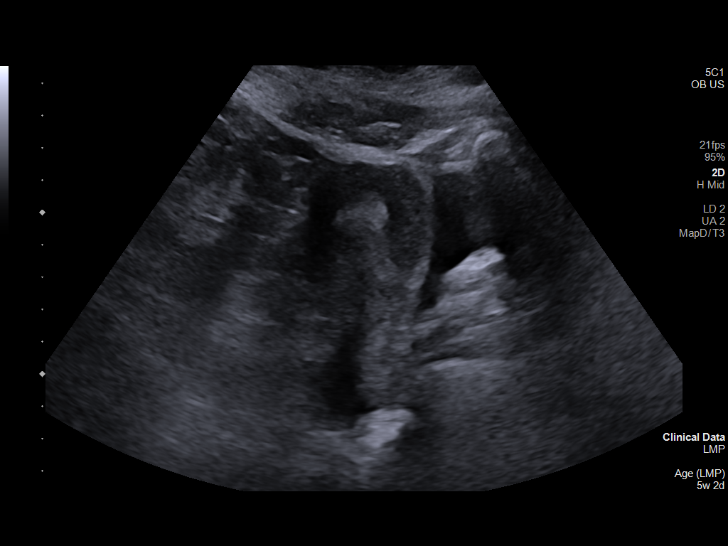
[im 11/97]
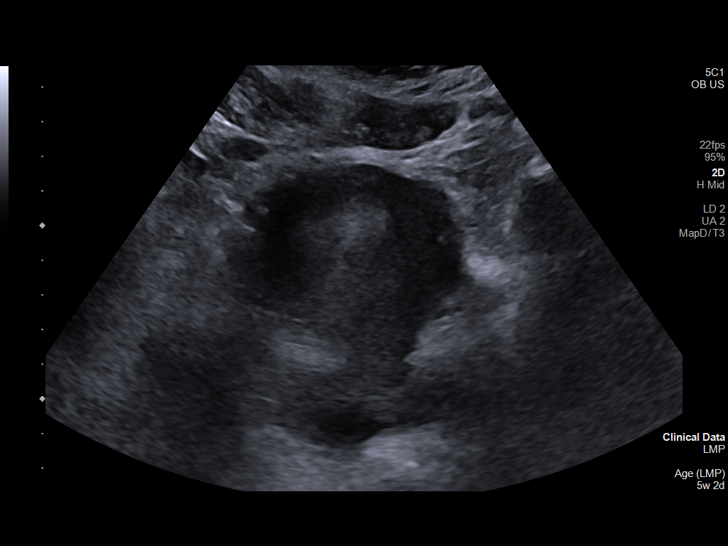
[im 18/97]
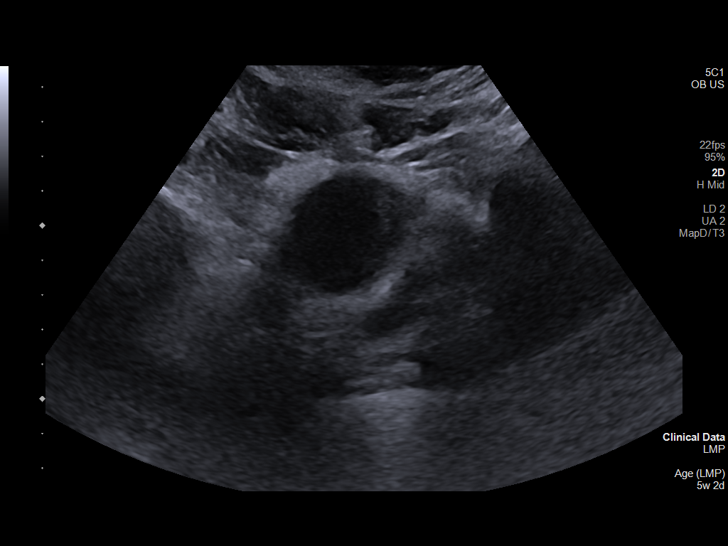
[im 25/97]
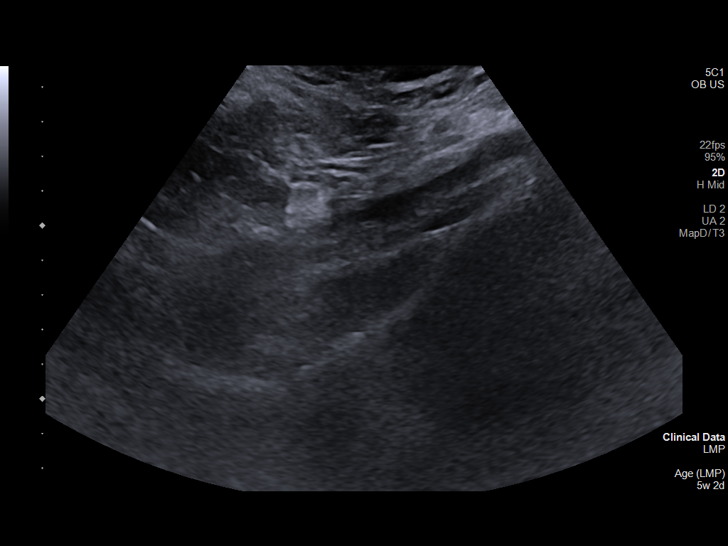
[im 33/97]
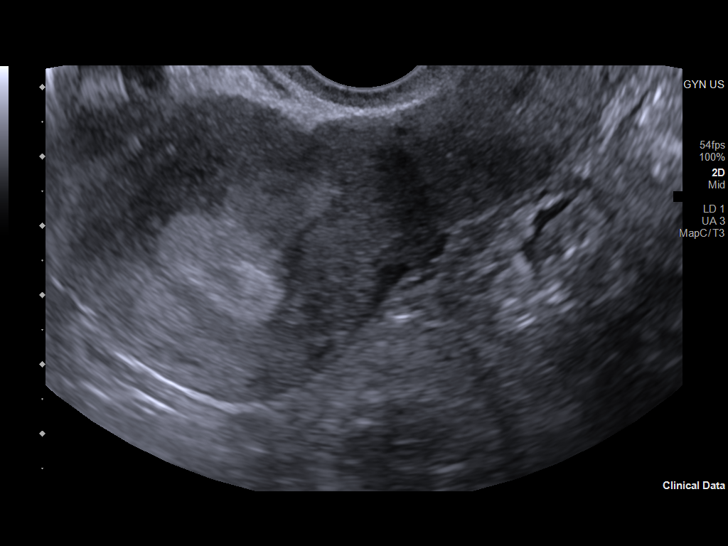
[im 40/97]
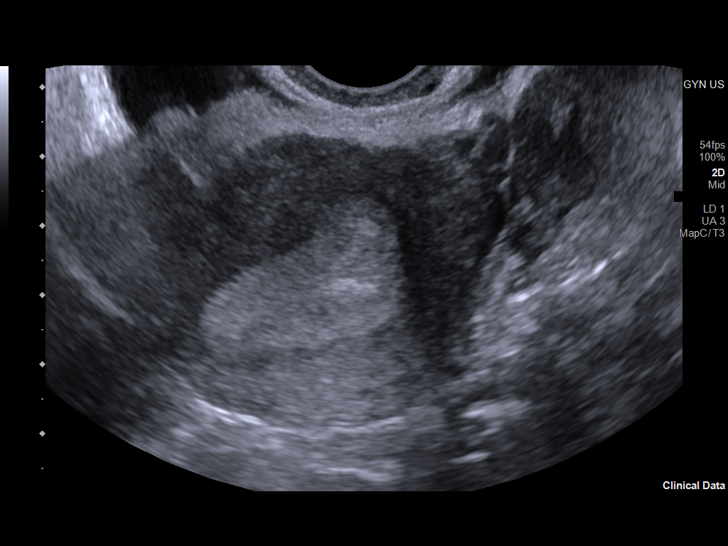
[im 47/97]
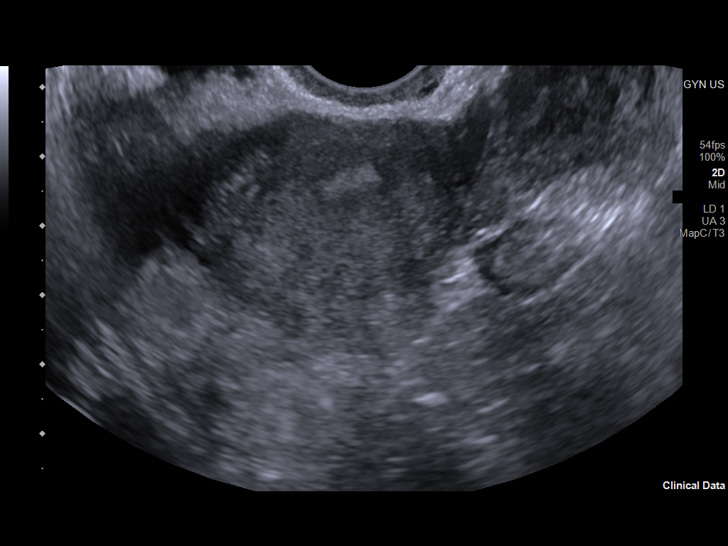
[im 54/97]
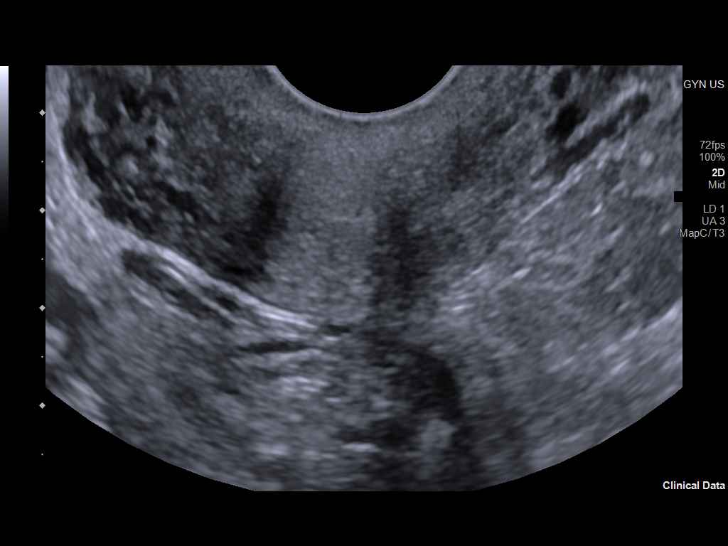
[im 61/97]
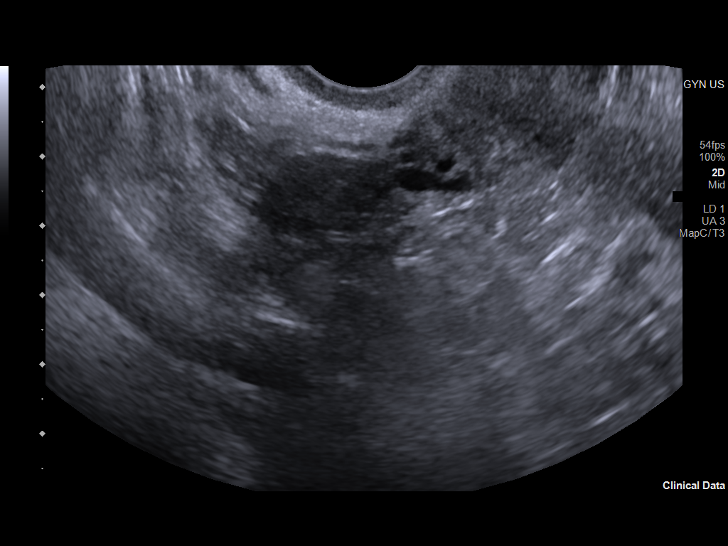
[im 68/97]
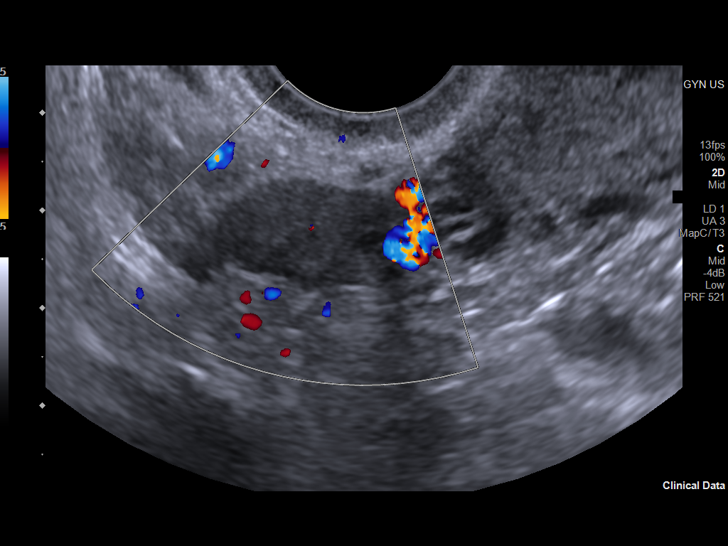
[im 75/97]
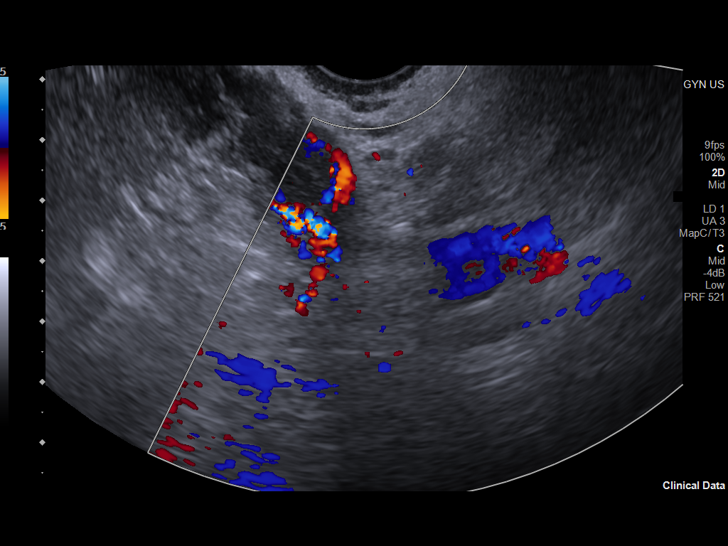
[im 82/97]
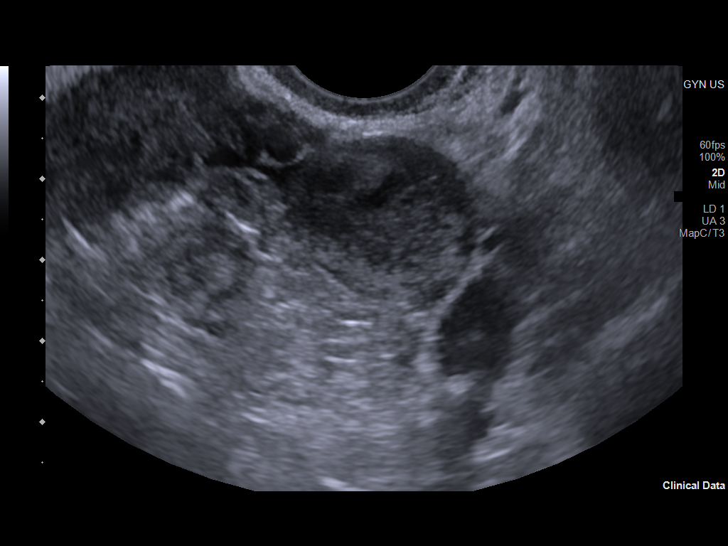
[im 89/97]
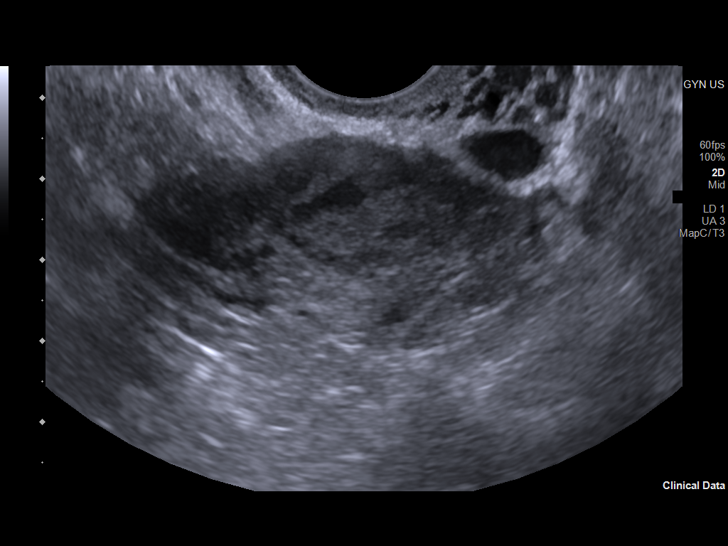
[im 97/97]
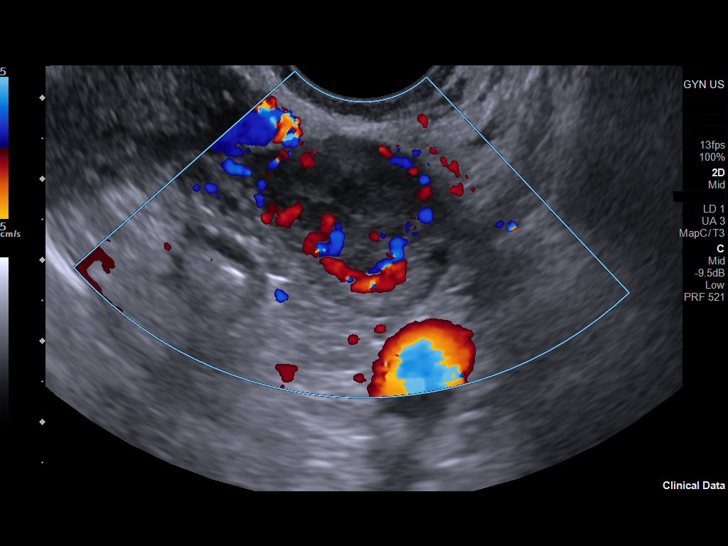

[14 of 28 positions shown; findings below may reference images not displayed]

FINDINGS: Intrauterine gestational sac: None.

Maternal uterus/adnexae: Thickened endometrium measuring up to
cm in the fundus without a focal abnormality identified.
Unremarkable appearance of the right ovary. Suspected collapsed
corpus luteum within the left ovary. No suspicious adnexal mass or
free fluid.
IMPRESSION: No intrauterine pregnancy or findings suspicious for ectopic
pregnancy. Findings are consistent with pregnancy of unknown
location and may reflect early intrauterine pregnancy not yet
visualized sonographically, occult ectopic pregnancy, or failed
pregnancy.

## 2024-03-14 ENCOUNTER — Other Ambulatory Visit: Payer: Self-pay | Admitting: Adult Health

## 2024-03-14 DIAGNOSIS — Z3201 Encounter for pregnancy test, result positive: Secondary | ICD-10-CM

## 2024-03-14 NOTE — Progress Notes (Signed)
Ck QHCG and progesterone level. 

## 2024-03-15 ENCOUNTER — Ambulatory Visit: Payer: Self-pay | Admitting: Adult Health

## 2024-03-15 LAB — BETA HCG QUANT (REF LAB): hCG Quant: <1 m[IU]/mL

## 2024-03-15 LAB — PROGESTERONE: Progesterone: 0.6 ng/mL
